# Patient Record
Sex: Female | Born: 1965 | Race: White | Hispanic: No | Marital: Married | Smoking: Former smoker
Health system: Southern US, Community
[De-identification: ages and names within clinical notes are randomized; demographics above are authoritative.]

## PROBLEM LIST (undated history)

## (undated) ENCOUNTER — Emergency Department (HOSPITAL_BASED_OUTPATIENT_CLINIC_OR_DEPARTMENT_OTHER): Admission: EM | Payer: Self-pay | Source: Home / Self Care

## (undated) DIAGNOSIS — F32A Depression, unspecified: Secondary | ICD-10-CM

## (undated) DIAGNOSIS — T7840XA Allergy, unspecified, initial encounter: Secondary | ICD-10-CM

## (undated) DIAGNOSIS — I10 Essential (primary) hypertension: Secondary | ICD-10-CM

## (undated) DIAGNOSIS — E079 Disorder of thyroid, unspecified: Secondary | ICD-10-CM

## (undated) DIAGNOSIS — F329 Major depressive disorder, single episode, unspecified: Secondary | ICD-10-CM

## (undated) HISTORY — DX: Allergy, unspecified, initial encounter: T78.40XA

---

## 1999-04-10 ENCOUNTER — Other Ambulatory Visit: Admission: RE | Admit: 1999-04-10 | Discharge: 1999-04-10 | Payer: Self-pay | Admitting: *Deleted

## 1999-05-15 ENCOUNTER — Encounter: Admission: RE | Admit: 1999-05-15 | Discharge: 1999-08-13 | Payer: Self-pay

## 2000-03-30 ENCOUNTER — Other Ambulatory Visit: Admission: RE | Admit: 2000-03-30 | Discharge: 2000-03-30 | Payer: Self-pay | Admitting: *Deleted

## 2001-04-26 ENCOUNTER — Other Ambulatory Visit: Admission: RE | Admit: 2001-04-26 | Discharge: 2001-04-26 | Payer: Self-pay | Admitting: *Deleted

## 2002-06-08 ENCOUNTER — Other Ambulatory Visit: Admission: RE | Admit: 2002-06-08 | Discharge: 2002-06-08 | Payer: Self-pay | Admitting: *Deleted

## 2003-07-04 ENCOUNTER — Other Ambulatory Visit: Admission: RE | Admit: 2003-07-04 | Discharge: 2003-07-04 | Payer: Self-pay | Admitting: *Deleted

## 2004-11-26 ENCOUNTER — Other Ambulatory Visit: Admission: RE | Admit: 2004-11-26 | Discharge: 2004-11-26 | Payer: Self-pay | Admitting: *Deleted

## 2008-11-10 ENCOUNTER — Emergency Department (HOSPITAL_COMMUNITY): Admission: EM | Admit: 2008-11-10 | Discharge: 2008-11-10 | Payer: Self-pay | Admitting: Family Medicine

## 2011-09-02 ENCOUNTER — Encounter: Payer: Self-pay | Admitting: *Deleted

## 2011-09-02 ENCOUNTER — Emergency Department (INDEPENDENT_AMBULATORY_CARE_PROVIDER_SITE_OTHER): Payer: Self-pay

## 2011-09-02 ENCOUNTER — Emergency Department (HOSPITAL_BASED_OUTPATIENT_CLINIC_OR_DEPARTMENT_OTHER)
Admission: EM | Admit: 2011-09-02 | Discharge: 2011-09-02 | Disposition: A | Discharge status: Home / Self Care | Payer: Self-pay | Attending: Emergency Medicine | Admitting: Emergency Medicine

## 2011-09-02 DIAGNOSIS — R42 Dizziness and giddiness: Secondary | ICD-10-CM

## 2011-09-02 DIAGNOSIS — R110 Nausea: Secondary | ICD-10-CM

## 2011-09-02 DIAGNOSIS — E079 Disorder of thyroid, unspecified: Secondary | ICD-10-CM | POA: Insufficient documentation

## 2011-09-02 DIAGNOSIS — F329 Major depressive disorder, single episode, unspecified: Secondary | ICD-10-CM | POA: Insufficient documentation

## 2011-09-02 DIAGNOSIS — I10 Essential (primary) hypertension: Secondary | ICD-10-CM

## 2011-09-02 DIAGNOSIS — F17200 Nicotine dependence, unspecified, uncomplicated: Secondary | ICD-10-CM | POA: Insufficient documentation

## 2011-09-02 DIAGNOSIS — Z79899 Other long term (current) drug therapy: Secondary | ICD-10-CM | POA: Insufficient documentation

## 2011-09-02 DIAGNOSIS — F3289 Other specified depressive episodes: Secondary | ICD-10-CM | POA: Insufficient documentation

## 2011-09-02 HISTORY — DX: Depression, unspecified: F32.A

## 2011-09-02 HISTORY — DX: Essential (primary) hypertension: I10

## 2011-09-02 HISTORY — DX: Disorder of thyroid, unspecified: E07.9

## 2011-09-02 HISTORY — DX: Major depressive disorder, single episode, unspecified: F32.9

## 2011-09-02 LAB — BASIC METABOLIC PANEL
BUN: 8 mg/dL (ref 6–23)
CO2: 25 mEq/L (ref 19–32)
Chloride: 101 mEq/L (ref 96–112)
Creatinine, Ser: 0.6 mg/dL (ref 0.50–1.10)
Glucose, Bld: 102 mg/dL — ABNORMAL HIGH (ref 70–99)

## 2011-09-02 LAB — CBC
HCT: 43.7 % (ref 36.0–46.0)
Hemoglobin: 15.3 g/dL — ABNORMAL HIGH (ref 12.0–15.0)
MCV: 92.4 fL (ref 78.0–100.0)
RBC: 4.73 MIL/uL (ref 3.87–5.11)
WBC: 8.2 10*3/uL (ref 4.0–10.5)

## 2011-09-02 MED ORDER — MECLIZINE HCL 25 MG PO TABS: 25.0000 mg | ORAL_TABLET | Freq: Four times a day (QID) | ORAL | Status: AC

## 2011-09-02 MED ORDER — SODIUM CHLORIDE 0.9 % IV BOLUS (SEPSIS)
250.0000 mL | Freq: Once | INTRAVENOUS | Status: AC
  Administered 2011-09-02: 250 mL via INTRAVENOUS

## 2011-09-02 MED ORDER — LABETALOL HCL 100 MG PO TABS: 100.0000 mg | ORAL_TABLET | Freq: Two times a day (BID) | ORAL | Status: DC

## 2011-09-02 MED ORDER — LABETALOL HCL 5 MG/ML IV SOLN
20.0000 mg | Freq: Once | INTRAVENOUS | Status: AC
  Administered 2011-09-02: 20 mg via INTRAVENOUS
  Filled 2011-09-02: qty 4

## 2011-09-02 MED ORDER — AMLODIPINE BESYLATE 10 MG PO TABS: 10.0000 mg | ORAL_TABLET | Freq: Every day | ORAL | Status: DC

## 2011-09-02 MED ORDER — SODIUM CHLORIDE 0.9 % IV SOLN
INTRAVENOUS | Status: DC
  Administered 2011-09-02: 1000 mL via INTRAVENOUS

## 2011-09-02 NOTE — ED Provider Notes (Signed)
History     CSN: [id number] Arrival date & time: 09/02/2011  9:35 AM   First MD Initiated Contact with Patient 09/02/11 [phone]      Chief Complaint  Patient presents with  . Hypertension    (Consider location/radiation/quality/duration/timing/severity/associated sxs/prior treatment) The history is provided by the patient.   patient is a 45 year old female with long-standing history of hypertension has been noncompliant and off meds for many months. At work today patient was experiencing vertigo room spinning she has recently had an upper respiratory infection which is resolving. Patient went to her primary care physician who sent her here by ambulance for a blood pressure of 232/140. Patient denies chest pain shortness of breath focal stroke symptoms or severe headache.  The vertigo is described as room spinning and made worse by movement of her head. Denies headache.  Past Medical History  Diagnosis Date  . Hypertension   . Thyroid disease   . Depression     History reviewed. No pertinent past surgical history.  No family history on file.  History  Substance Use Topics  . Smoking status: Current Some Day Smoker -- 1.0 packs/day for 10 years  . Smokeless tobacco: Not on file  . Alcohol Use: No    OB History    Grav Para Term Preterm Abortions TAB SAB Ect Mult Living                  Review of Systems  Constitutional: Negative for fever and chills.  HENT: Positive for congestion. Negative for sore throat and neck pain.   Eyes: Negative for photophobia, pain, redness and visual disturbance.  Respiratory: Negative for cough and shortness of breath.   Cardiovascular: Negative for chest pain, palpitations and leg swelling.  Gastrointestinal: Negative for nausea, vomiting and abdominal pain.  Genitourinary: Negative for dysuria and hematuria.  Musculoskeletal: Negative for myalgias and back pain.  Skin: Negative for rash.  Neurological: Positive for dizziness. Negative  for syncope, facial asymmetry, speech difficulty, weakness, light-headedness, numbness and headaches.  Hematological: Negative for adenopathy. Does not bruise/bleed easily.    Allergies  Review of patient's allergies indicates no known allergies.  Home Medications   Current Outpatient Rx  Name Route Sig Dispense Refill  . AMLODIPINE BESYLATE 10 MG PO TABS Oral Take 10 mg by mouth daily.      . ATENOLOL 50 MG PO TABS Oral Take 50 mg by mouth daily.      Marland Kitchen CITALOPRAM HYDROBROMIDE 20 MG PO TABS Oral Take 20 mg by mouth daily.      Marland Kitchen LABETALOL HCL 100 MG PO TABS Oral Take 100 mg by mouth 2 (two) times daily.      Marland Kitchen LEVOTHYROXINE SODIUM 100 MCG PO TABS Oral Take 100 mcg by mouth daily.      Marland Kitchen LISINOPRIL-HYDROCHLOROTHIAZIDE 20-12.5 MG PO TABS Oral Take 1 tablet by mouth daily.        BP 213/113  Pulse 72  Temp(Src) 98.7 F (37.1 C) (Oral)  Resp 20  SpO2 99%  LMP 08/27/2011  Physical Exam  Nursing note and vitals reviewed. Constitutional: She is oriented to person, place, and time. She appears well-developed and well-nourished. No distress.  HENT:  Head: Normocephalic and atraumatic.  Mouth/Throat: Oropharynx is clear and moist.  Eyes: Conjunctivae and EOM are normal. Pupils are equal, round, and reactive to light. Right eye exhibits no discharge.  Neck: Normal range of motion. Neck supple. No tracheal deviation present.  Cardiovascular: Normal rate, regular rhythm and  normal heart sounds.   No murmur heard. Pulmonary/Chest: Effort normal and breath sounds normal.  Abdominal: Soft. Bowel sounds are normal.  Musculoskeletal: Normal range of motion. She exhibits no edema and no tenderness.  Lymphadenopathy:    She has no cervical adenopathy.  Neurological: She is alert and oriented to person, place, and time. No cranial nerve deficit. She exhibits normal muscle tone. Coordination normal.       Reproducible vertigo with movement of head to left and right side.  Skin: Skin is warm  and dry. No rash noted. She is not diaphoretic.    ED Course  Procedures (including critical care time)   Date: 09/02/2011  Rate: 76  Rhythm: normal sinus rhythm  QRS Axis: normal  Intervals: normal  ST/T Wave abnormalities: nonspecific ST/T changes  Conduction Disutrbances:none  Narrative Interpretation:   Old EKG Reviewed: none available    Labs Reviewed  CBC  BASIC METABOLIC PANEL   Results for orders placed during the hospital encounter of 09/02/11  CBC      Component Value Range   WBC 8.2  4.0 - 10.5 (K/uL)   RBC 4.73  3.87 - 5.11 (MIL/uL)   Hemoglobin 15.3 (*) 12.0 - 15.0 (g/dL)   HCT 45.4  09.8 - 11.9 (%)   MCV 92.4  78.0 - 100.0 (fL)   MCH 32.3  26.0 - 34.0 (pg)   MCHC 35.0  30.0 - 36.0 (g/dL)   RDW 14.7  82.9 - 56.2 (%)   Platelets 242  150 - 400 (K/uL)  BASIC METABOLIC PANEL      Component Value Range   Sodium 138  135 - 145 (mEq/L)   Potassium 3.5  3.5 - 5.1 (mEq/L)   Chloride 101  96 - 112 (mEq/L)   CO2 25  19 - 32 (mEq/L)   Glucose, Bld 102 (*) 70 - 99 (mg/dL)   BUN 8  6 - 23 (mg/dL)   Creatinine, Ser 1.30  0.50 - 1.10 (mg/dL)   Calcium 9.1  8.4 - 86.5 (mg/dL)   GFR calc non Af Amer >90  >90 (mL/min)   GFR calc Af Amer >90  >90 (mL/min)   Results for orders placed during the hospital encounter of 09/02/11  CBC      Component Value Range   WBC 8.2  4.0 - 10.5 (K/uL)   RBC 4.73  3.87 - 5.11 (MIL/uL)   Hemoglobin 15.3 (*) 12.0 - 15.0 (g/dL)   HCT 78.4  69.6 - 29.5 (%)   MCV 92.4  78.0 - 100.0 (fL)   MCH 32.3  26.0 - 34.0 (pg)   MCHC 35.0  30.0 - 36.0 (g/dL)   RDW 28.4  13.2 - 44.0 (%)   Platelets 242  150 - 400 (K/uL)  BASIC METABOLIC PANEL      Component Value Range   Sodium 138  135 - 145 (mEq/L)   Potassium 3.5  3.5 - 5.1 (mEq/L)   Chloride 101  96 - 112 (mEq/L)   CO2 25  19 - 32 (mEq/L)   Glucose, Bld 102 (*) 70 - 99 (mg/dL)   BUN 8  6 - 23 (mg/dL)   Creatinine, Ser 1.02  0.50 - 1.10 (mg/dL)   Calcium 9.1  8.4 - 72.5 (mg/dL)   GFR  calc non Af Amer >90  >90 (mL/min)   GFR calc Af Amer >90  >90 (mL/min)   Ct Head Wo Contrast  09/02/2011  *RADIOLOGY REPORT*  Clinical Data: 45 year old female with hypertension,  elevated blood pressure, dizziness and nausea.  CT HEAD WITHOUT CONTRAST  Technique:  Contiguous axial images were obtained from the base of the skull through the vertex without contrast.  Comparison: None.  Findings: Fluid levels in the visualized maxillary sinuses.  Other visualized paranasal sinuses and mastoids are clear.  No acute osseous abnormality identified.  Visualized orbits and scalp soft tissues are within normal limits.  Cerebral volume is within normal limits for age.  No midline shift, ventriculomegaly, mass effect, evidence of mass lesion, intracranial hemorrhage or evidence of cortically based acute infarction.  Gray-white matter differentiation is within normal limits throughout the brain.  No suspicious intracranial vascular hyperdensity.  IMPRESSION: 1.  Normal noncontrast CT appearance of the brain. 2.  Maxillary sinus air-fluid levels could reflect acute sinusitis in the appropriate clinical setting.  Original Report Authenticated By: Harley Hallmark, M.D.      IMP: Uncontrolled hypertension Vertigo   MDM   Patient with uncontrolled hypertension improved somewhat with IV labetalol in the emergency department. Patient was given 20 mg IV. Blood pressure improves some count 185/109. Patient's symptoms improved. Patient did not have any chest pain shortness of breath severe headache or strokelike symptoms with the exception of vertigo. Head CT was negative. Patient has also had an upper respiratory infection the past few days and suspect that the vertigo may be viral etiology and not related to direct brain affect or stroke. Patient has been noncompliant on her hypertensive meds for months. Referred here from her primary care physician's office. We'll renew her labetalol and Norvasc that she supposed to be  on an patient will follow up with her primary care to make sure blood pressure gets under control and return here for new worse symptoms.         Shelda Jakes, MD 09/02/11 1440

## 2011-09-02 NOTE — ED Notes (Signed)
Patient states she has not felt good for the last 2-3 weeks.  Using OTC cold meds for upper respiratory congestion.  Woke up this morning and was dizzy and took an OTC dramimine with minimal relief.  Went to work and became weak and dizzy.  Went to see her doctor at [facility] and found to have a elevated bp of 250/120.  EMS called and patient was transported to ed.

## 2011-09-02 NOTE — ED Notes (Signed)
EMS reports patient was her PCP office this morning with c/o dizziness and weakness.  Blood pressure 232/140.  Patient is non compliant with her 2 beta blockers to control her blood pressure and her thyroid medication.  NSR on monitor.

## 2011-09-15 ENCOUNTER — Encounter (HOSPITAL_BASED_OUTPATIENT_CLINIC_OR_DEPARTMENT_OTHER): Payer: Self-pay | Admitting: *Deleted

## 2011-09-15 ENCOUNTER — Emergency Department (HOSPITAL_BASED_OUTPATIENT_CLINIC_OR_DEPARTMENT_OTHER)
Admission: EM | Admit: 2011-09-15 | Discharge: 2011-09-15 | Disposition: A | Discharge status: Home / Self Care | Payer: Self-pay | Attending: Emergency Medicine | Admitting: Emergency Medicine

## 2011-09-15 DIAGNOSIS — I10 Essential (primary) hypertension: Secondary | ICD-10-CM | POA: Insufficient documentation

## 2011-09-15 DIAGNOSIS — K029 Dental caries, unspecified: Secondary | ICD-10-CM | POA: Insufficient documentation

## 2011-09-15 DIAGNOSIS — R220 Localized swelling, mass and lump, head: Secondary | ICD-10-CM | POA: Insufficient documentation

## 2011-09-15 DIAGNOSIS — Z79899 Other long term (current) drug therapy: Secondary | ICD-10-CM | POA: Insufficient documentation

## 2011-09-15 DIAGNOSIS — K089 Disorder of teeth and supporting structures, unspecified: Secondary | ICD-10-CM | POA: Insufficient documentation

## 2011-09-15 DIAGNOSIS — E079 Disorder of thyroid, unspecified: Secondary | ICD-10-CM | POA: Insufficient documentation

## 2011-09-15 MED ORDER — PENICILLIN V POTASSIUM 250 MG PO TABS
500.0000 mg | ORAL_TABLET | Freq: Once | ORAL | Status: AC
  Administered 2011-09-15: 500 mg via ORAL
  Filled 2011-09-15: qty 2

## 2011-09-15 MED ORDER — OXYCODONE-ACETAMINOPHEN 5-325 MG PO TABS: 2.0000 | ORAL_TABLET | Freq: Four times a day (QID) | ORAL | Status: AC | PRN

## 2011-09-15 MED ORDER — PENICILLIN V POTASSIUM 500 MG PO TABS: 500.0000 mg | ORAL_TABLET | Freq: Four times a day (QID) | ORAL | Status: AC

## 2011-09-15 NOTE — ED Notes (Signed)
4 day history of swelling to right lower jaw and chin 2 days ago started having lower lip swelling. States she has a lower tooth that is broken off even with the gum thinks she has developed an abcess there

## 2011-09-15 NOTE — ED Provider Notes (Signed)
History     CSN: [id number] Arrival date & time: 09/15/2011 10:33 AM   First MD Initiated Contact with Patient 09/15/11 1049      Chief Complaint  Patient presents with  . Facial Swelling  . Dental Pain    (Consider location/radiation/quality/duration/timing/severity/associated sxs/prior treatment) HPI 45 yo F complains of 10/10 R mandibular quadrant mouth pain with associated swelling for the past week.  Patient with dental caries.  Patient has not seen a dentist.  Denies difficulty with breathing or swallowing.  Does have associated facial swelling but no fevers, nausea, or vomiting.  No radiation of this "sharp and throbbing" pain. Past Medical History  Diagnosis Date  . Hypertension   . Thyroid disease   . Depression     History reviewed. No pertinent past surgical history.  History reviewed. No pertinent family history.  History  Substance Use Topics  . Smoking status: Current Some Day Smoker -- 1.0 packs/day for 10 years  . Smokeless tobacco: Not on file  . Alcohol Use: No    OB History    Grav Para Term Preterm Abortions TAB SAB Ect Mult Living                  Review of Systems  HENT: Positive for facial swelling and dental problem.   All other systems reviewed and are negative.    Allergies  Review of patient's allergies indicates no known allergies.  Home Medications   Current Outpatient Rx  Name Route Sig Dispense Refill  . AMLODIPINE BESYLATE 10 MG PO TABS Oral Take 10 mg by mouth daily.     Marland Kitchen AMLODIPINE BESYLATE 10 MG PO TABS Oral Take 10 mg by mouth daily.      . ATENOLOL 50 MG PO TABS Oral Take 50 mg by mouth daily.     Marland Kitchen CITALOPRAM HYDROBROMIDE 20 MG PO TABS Oral Take 20 mg by mouth daily.     Marland Kitchen HYDROCODONE-ACETAMINOPHEN 5-500 MG PO TABS Oral Take 1 tablet by mouth every 6 (six) hours as needed. Patient said was not her medication but was in so much pain took someone else's     . IBUPROFEN 200 MG PO TABS Oral Take 800 mg by mouth every 6  (six) hours as needed. For pain to face     . LABETALOL HCL 100 MG PO TABS Oral Take 100 mg by mouth 2 (two) times daily.     Marland Kitchen LEVOTHYROXINE SODIUM 100 MCG PO TABS Oral Take 100 mcg by mouth daily.     Marland Kitchen LISINOPRIL-HYDROCHLOROTHIAZIDE 20-12.5 MG PO TABS Oral Take 1 tablet by mouth daily.      . OXYCODONE-ACETAMINOPHEN 5-325 MG PO TABS Oral Take 2 tablets by mouth every 6 (six) hours as needed for pain. 20 tablet 0  . PENICILLIN V POTASSIUM 500 MG PO TABS Oral Take 1 tablet (500 mg total) by mouth 4 (four) times daily. 28 tablet 0    BP 160/80  Pulse 83  Temp(Src) 98.2 F (36.8 C) (Oral)  Resp 20  SpO2 100%  LMP 08/27/2011  Physical Exam  Nursing note and vitals reviewed. Constitutional: She is oriented to person, place, and time. She appears well-developed and well-nourished. No distress.  HENT:  Head: Normocephalic and atraumatic.  Mouth/Throat: Oropharynx is clear and moist.         Facial swelling along the buccal mucosa near affected tooth. No dental abscess noted  Eyes: Conjunctivae and EOM are normal. Pupils are equal, round, and reactive to  light.  Neck: Normal range of motion.  Neurological: She is alert and oriented to person, place, and time. No cranial nerve deficit. She exhibits normal muscle tone. Coordination normal.  Skin: Skin is warm and dry. No rash noted.  Psychiatric: She has a normal mood and affect.    ED Course  Procedures (including critical care time)  Labs Reviewed - No data to display No results found.   1. Dental caries       MDM  Patient evaluated with no obvious dental abscess.  Patient was given a dose of Pen VK here and told it is imperative that she see a dentist. She was given a brief Rx for pain meds and antibiotic and was discharged home in good condition with precautions for reasons to return.        Cyndra Numbers, MD 09/15/11 2240

## 2012-11-24 IMAGING — CT CT HEAD W/O CM
1 series · 16 of 30 positions shown, 20 images · non-contrast
Comparison: None.

CLINICAL DATA: 44-year-old female with hypertension, elevated blood
pressure, dizziness and nausea.

CT HEAD WITHOUT CONTRAST
TECHNIQUE: Contiguous axial images were obtained from the base of
the skull through the vertex without contrast.

[Series 2: head 4.8 h37s · axial · 0.45mm/px · z∈[+1108,+1244]mm · 16 of 32 slices shown, 20 images]
[im 2/32  brain]
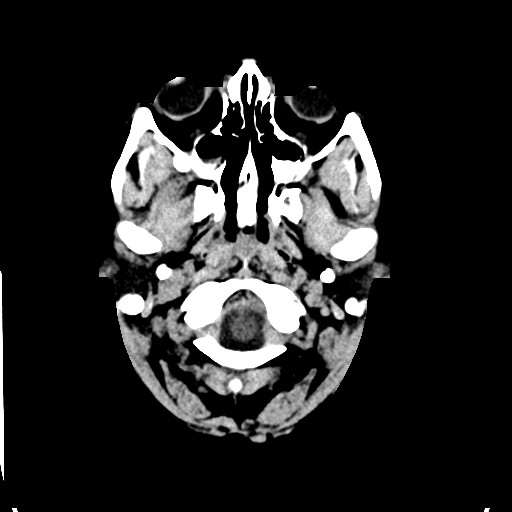
[im 2/32  bone]
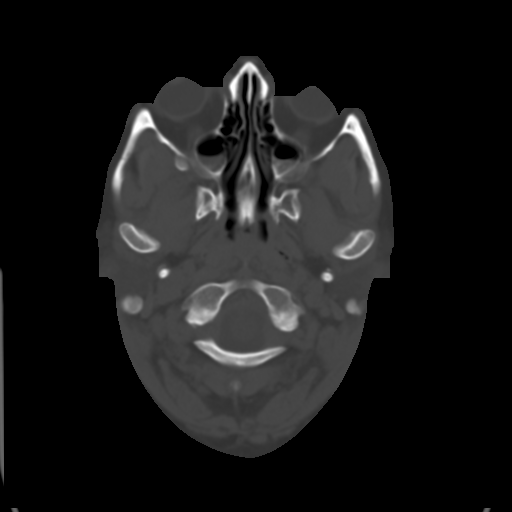
[im 4/32  brain]
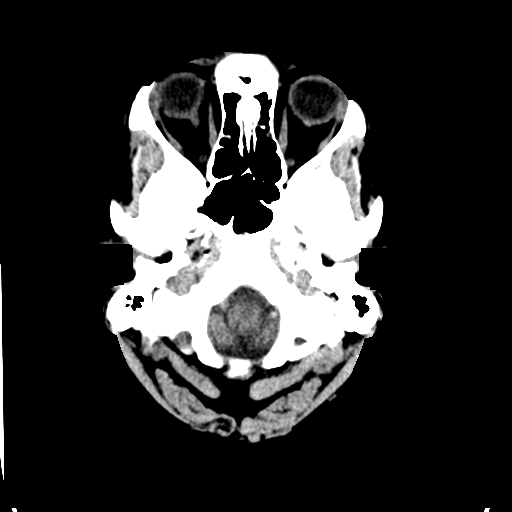
[im 6/32  brain]
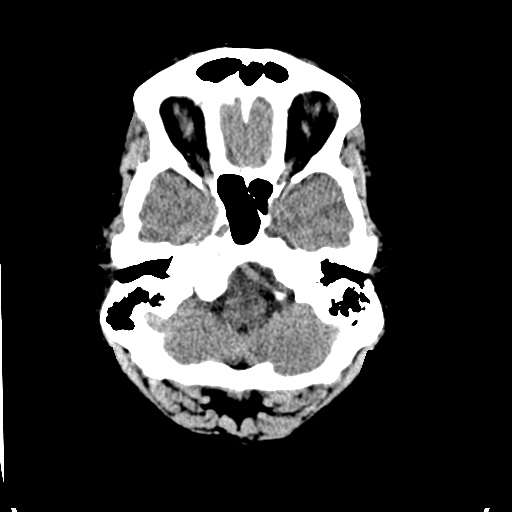
[im 8/32  brain]
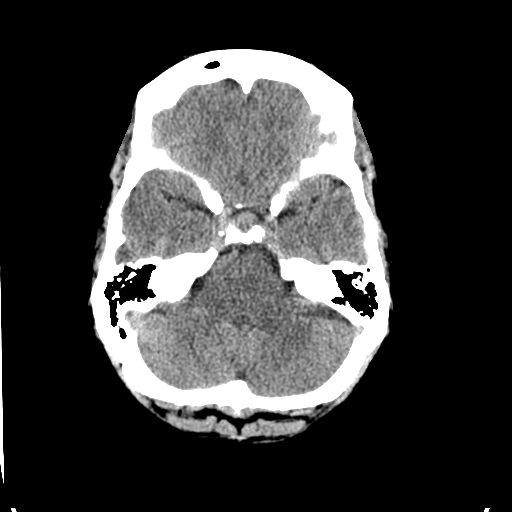
[im 9/32  brain]
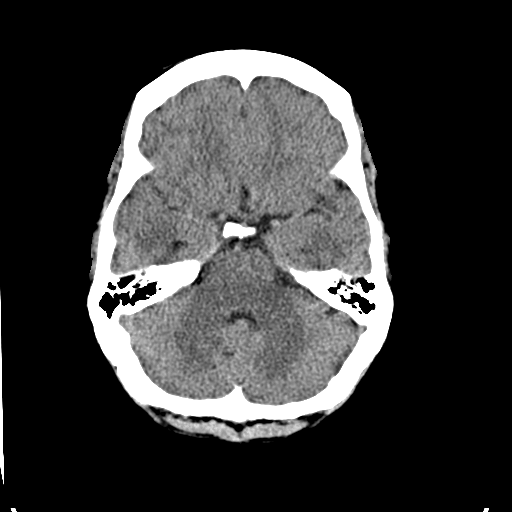
[im 9/32  bone]
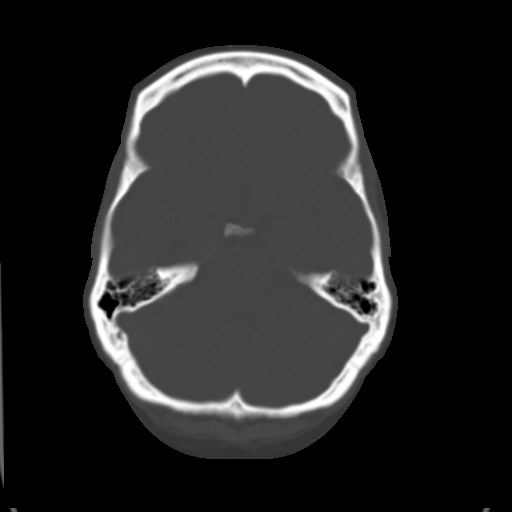
[im 11/32  brain]
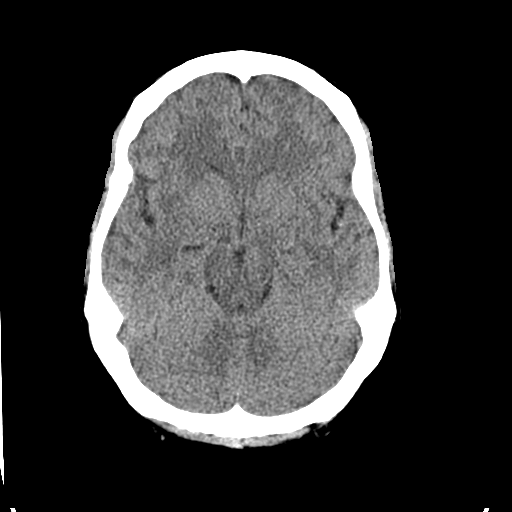
[im 13/32  brain]
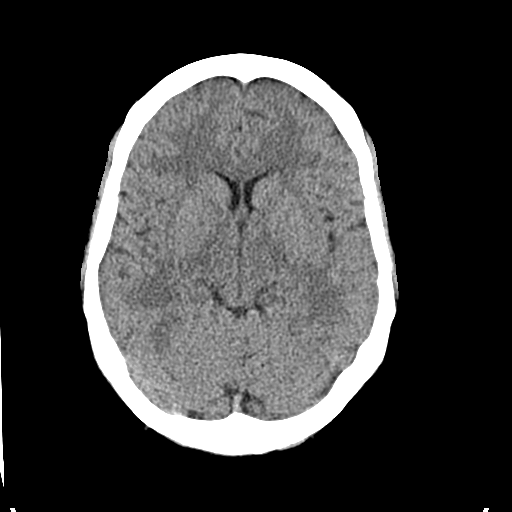
[im 15/32  brain]
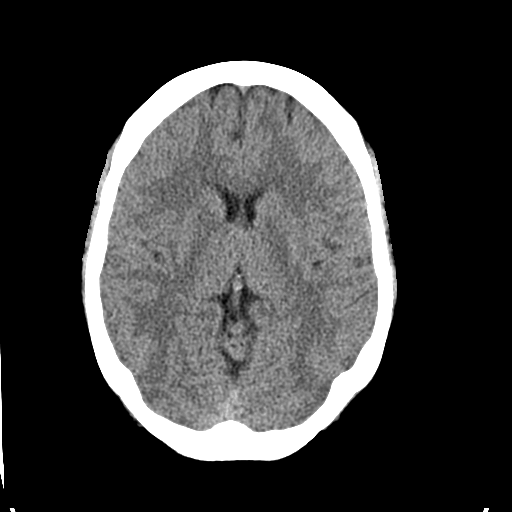
[im 17/32  brain]
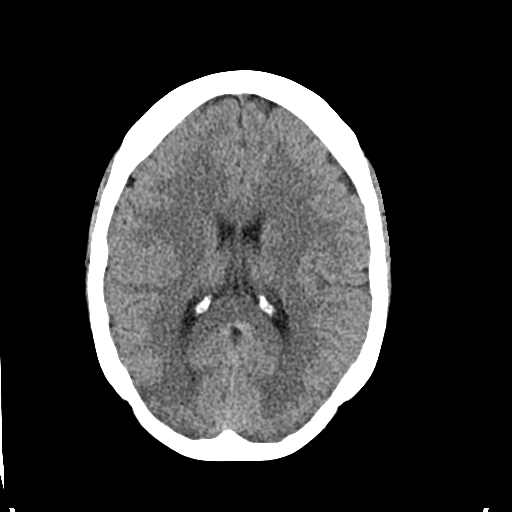
[im 17/32  bone]
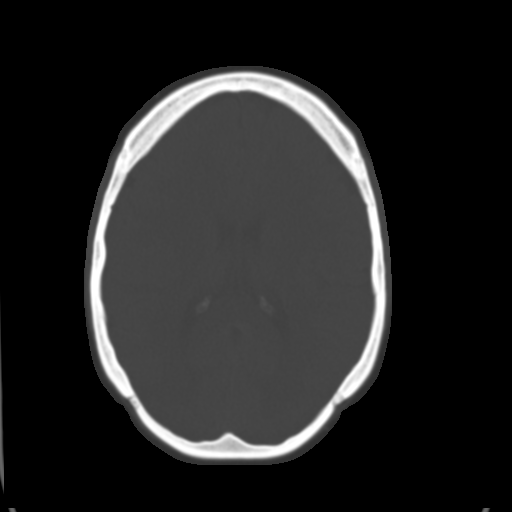
[im 19/32  brain]
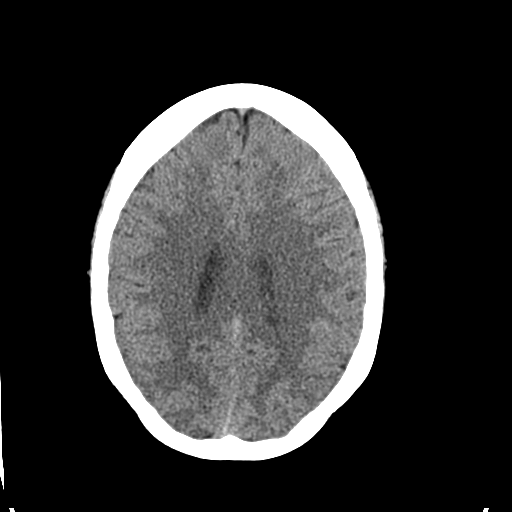
[im 21/32  brain]
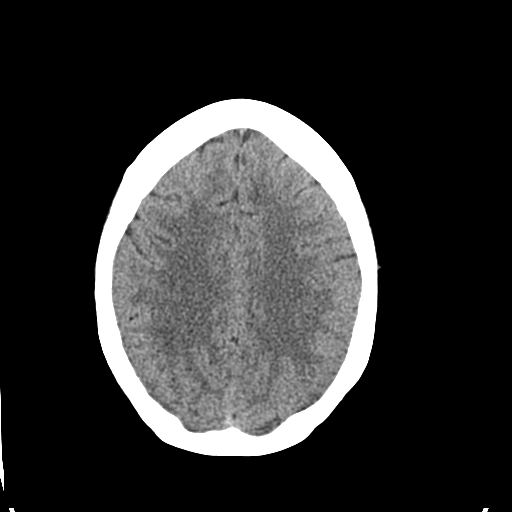
[im 23/32  brain]
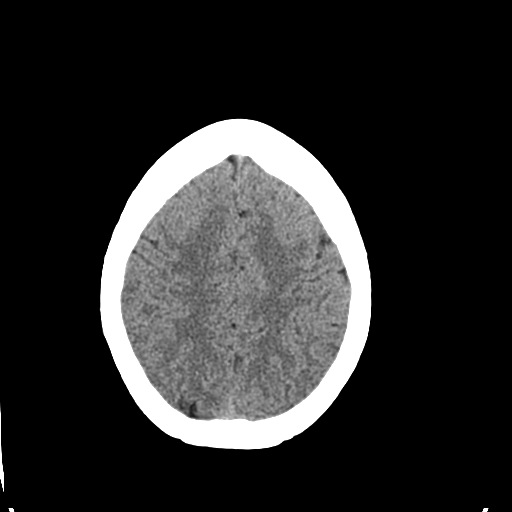
[im 24/32  brain]
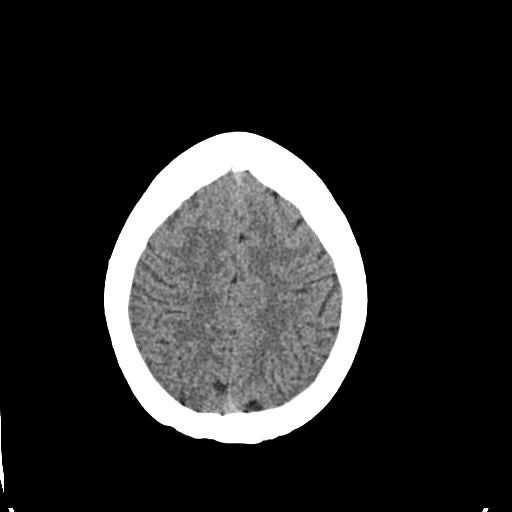
[im 24/32  bone]
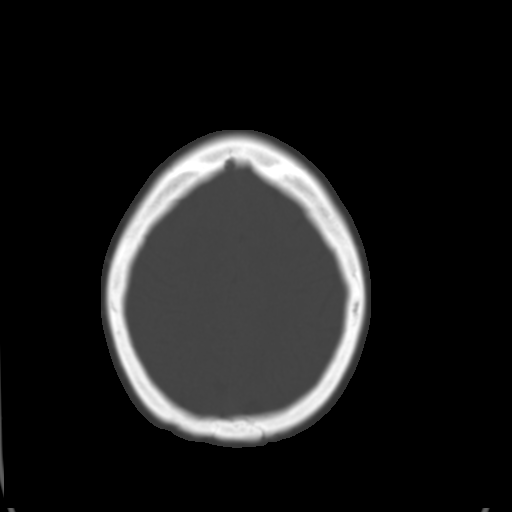
[im 26/32  brain]
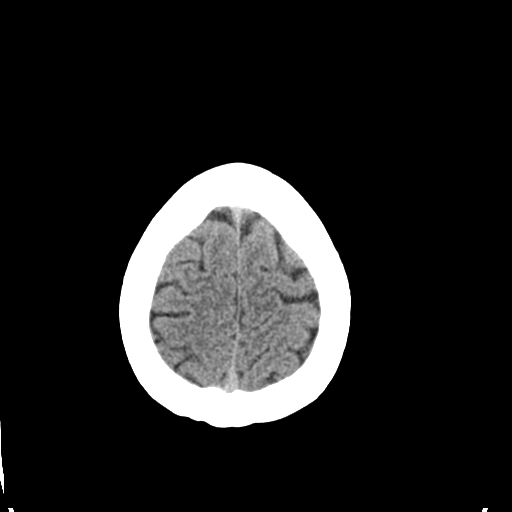
[im 28/32  brain]
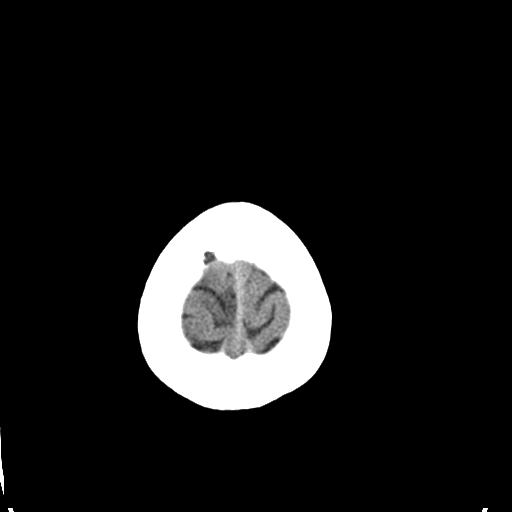
[im 30/32  brain]
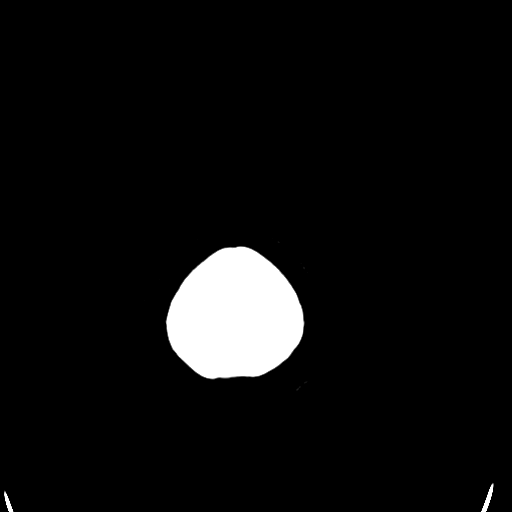

[16 of 30 positions shown; findings below may reference images not displayed]

FINDINGS: Fluid levels in the visualized maxillary sinuses.  Other
visualized paranasal sinuses and mastoids are clear.  No acute
osseous abnormality identified.  Visualized orbits and scalp soft
tissues are within normal limits.

Cerebral volume is within normal limits for age.  No midline shift,
ventriculomegaly, mass effect, evidence of mass lesion,
intracranial hemorrhage or evidence of cortically based acute
infarction.  Gray-white matter differentiation is within normal
limits throughout the brain.  No suspicious intracranial vascular
hyperdensity.
IMPRESSION: 1.  Normal noncontrast CT appearance of the brain.
2.  Maxillary sinus air-fluid levels could reflect acute sinusitis
in the appropriate clinical setting.

## 2014-02-23 ENCOUNTER — Encounter (HOSPITAL_BASED_OUTPATIENT_CLINIC_OR_DEPARTMENT_OTHER): Payer: Self-pay | Admitting: Emergency Medicine

## 2014-02-23 ENCOUNTER — Inpatient Hospital Stay (HOSPITAL_BASED_OUTPATIENT_CLINIC_OR_DEPARTMENT_OTHER)
Admission: EM | Admit: 2014-02-23 | Discharge: 2014-02-26 | DRG: 305 | Disposition: A | Discharge status: Home / Self Care | Payer: Self-pay | Attending: Internal Medicine | Admitting: Internal Medicine

## 2014-02-23 DIAGNOSIS — I359 Nonrheumatic aortic valve disorder, unspecified: Secondary | ICD-10-CM | POA: Diagnosis present

## 2014-02-23 DIAGNOSIS — J3489 Other specified disorders of nose and nasal sinuses: Secondary | ICD-10-CM | POA: Diagnosis present

## 2014-02-23 DIAGNOSIS — R5383 Other fatigue: Secondary | ICD-10-CM | POA: Diagnosis present

## 2014-02-23 DIAGNOSIS — E039 Hypothyroidism, unspecified: Secondary | ICD-10-CM | POA: Diagnosis present

## 2014-02-23 DIAGNOSIS — Z87891 Personal history of nicotine dependence: Secondary | ICD-10-CM

## 2014-02-23 DIAGNOSIS — Z79899 Other long term (current) drug therapy: Secondary | ICD-10-CM

## 2014-02-23 DIAGNOSIS — R5381 Other malaise: Secondary | ICD-10-CM | POA: Diagnosis present

## 2014-02-23 DIAGNOSIS — E079 Disorder of thyroid, unspecified: Secondary | ICD-10-CM | POA: Diagnosis present

## 2014-02-23 DIAGNOSIS — E876 Hypokalemia: Secondary | ICD-10-CM | POA: Diagnosis present

## 2014-02-23 DIAGNOSIS — J029 Acute pharyngitis, unspecified: Secondary | ICD-10-CM | POA: Diagnosis present

## 2014-02-23 DIAGNOSIS — I160 Hypertensive urgency: Secondary | ICD-10-CM | POA: Diagnosis present

## 2014-02-23 DIAGNOSIS — I10 Essential (primary) hypertension: Principal | ICD-10-CM | POA: Diagnosis present

## 2014-02-23 DIAGNOSIS — R0981 Nasal congestion: Secondary | ICD-10-CM | POA: Diagnosis present

## 2014-02-23 LAB — COMPREHENSIVE METABOLIC PANEL
ALBUMIN: 3.8 g/dL (ref 3.5–5.2)
ALBUMIN: 3.3 g/dL — AB (ref 3.5–5.2)
ALK PHOS: 115 U/L (ref 39–117)
ALK PHOS: 104 U/L (ref 39–117)
ALT: 9 U/L (ref 0–35)
ALT: 7 U/L (ref 0–35)
AST: 13 U/L (ref 0–37)
AST: 11 U/L (ref 0–37)
BILIRUBIN TOTAL: 0.2 mg/dL — AB (ref 0.3–1.2)
BILIRUBIN TOTAL: 0.2 mg/dL — AB (ref 0.3–1.2)
BUN: 7 mg/dL (ref 6–23)
BUN: 8 mg/dL (ref 6–23)
CHLORIDE: 96 meq/L (ref 96–112)
CO2: 29 mEq/L (ref 19–32)
CO2: 30 mEq/L (ref 19–32)
Calcium: 10 mg/dL (ref 8.4–10.5)
Calcium: 9.4 mg/dL (ref 8.4–10.5)
Chloride: 95 mEq/L — ABNORMAL LOW (ref 96–112)
Creatinine, Ser: 0.6 mg/dL (ref 0.50–1.10)
Creatinine, Ser: 0.61 mg/dL (ref 0.50–1.10)
GFR calc Af Amer: >90 mL/min (ref >90)
GFR calc non Af Amer: >90 mL/min (ref >90)
GFR calc non Af Amer: >90 mL/min (ref >90)
GLUCOSE: 121 mg/dL — AB (ref 70–99)
Glucose, Bld: 122 mg/dL — ABNORMAL HIGH (ref 70–99)
POTASSIUM: 2.4 meq/L — AB (ref 3.7–5.3)
POTASSIUM: 2.6 meq/L — AB (ref 3.7–5.3)
SODIUM: 141 meq/L (ref 137–147)
Sodium: 138 mEq/L (ref 137–147)
TOTAL PROTEIN: 7.7 g/dL (ref 6.0–8.3)
Total Protein: 7.2 g/dL (ref 6.0–8.3)

## 2014-02-23 LAB — BLOOD GAS, ARTERIAL
ACID-BASE EXCESS: 5.4 mmol/L — AB (ref 0.0–2.0)
BICARBONATE: 29 meq/L — AB (ref 20.0–24.0)
Drawn by: 36277
FIO2: 0.21 %
O2 SAT: 95.3 %
Patient temperature: 98.6
TCO2: 30.2 mmol/L (ref 0–100)
pCO2 arterial: 39.6 mmHg (ref 35.0–45.0)
pH, Arterial: 7.478 — ABNORMAL HIGH (ref 7.350–7.450)
pO2, Arterial: 72.6 mmHg — ABNORMAL LOW (ref 80.0–100.0)

## 2014-02-23 LAB — CBC WITH DIFFERENTIAL/PLATELET
BASOS PCT: 0 % (ref 0–1)
Basophils Absolute: 0 10*3/uL (ref 0.0–0.1)
EOS ABS: 0.1 10*3/uL (ref 0.0–0.7)
Eosinophils Relative: 0 % (ref 0–5)
HCT: 42 % (ref 36.0–46.0)
HEMOGLOBIN: 14.6 g/dL (ref 12.0–15.0)
LYMPHS ABS: 1.3 10*3/uL (ref 0.7–4.0)
Lymphocytes Relative: 11 % — ABNORMAL LOW (ref 12–46)
MCH: 33 pg (ref 26.0–34.0)
MCHC: 34.8 g/dL (ref 30.0–36.0)
MCV: 94.8 fL (ref 78.0–100.0)
MONOS PCT: 6 % (ref 3–12)
Monocytes Absolute: 0.7 10*3/uL (ref 0.1–1.0)
NEUTROS ABS: 9.9 10*3/uL — AB (ref 1.7–7.7)
NEUTROS PCT: 82 % — AB (ref 43–77)
PLATELETS: 417 10*3/uL — AB (ref 150–400)
RBC: 4.43 MIL/uL (ref 3.87–5.11)
RDW: 11.3 % — ABNORMAL LOW (ref 11.5–15.5)
WBC: 12.1 10*3/uL — ABNORMAL HIGH (ref 4.0–10.5)

## 2014-02-23 LAB — TSH: TSH: 6.3 u[IU]/mL — ABNORMAL HIGH (ref 0.350–4.500)

## 2014-02-23 LAB — MAGNESIUM: Magnesium: 2 mg/dL (ref 1.5–2.5)

## 2014-02-23 LAB — TROPONIN I

## 2014-02-23 MED ORDER — ENOXAPARIN SODIUM 40 MG/0.4ML ~~LOC~~ SOLN
40.0000 mg | SUBCUTANEOUS | Status: DC
  Administered 2014-02-24 – 2014-02-26 (×3): 40 mg via SUBCUTANEOUS
  Filled 2014-02-23 (×3): qty 0.4

## 2014-02-23 MED ORDER — CLONIDINE HCL 0.1 MG PO TABS
0.2000 mg | ORAL_TABLET | Freq: Once | ORAL | Status: AC
  Administered 2014-02-23: 0.2 mg via ORAL
  Filled 2014-02-23: qty 2

## 2014-02-23 MED ORDER — POTASSIUM CHLORIDE 10 MEQ/100ML IV SOLN
10.0000 meq | Freq: Once | INTRAVENOUS | Status: AC
  Administered 2014-02-23: 10 meq via INTRAVENOUS
  Filled 2014-02-23: qty 100

## 2014-02-23 MED ORDER — ATENOLOL 50 MG PO TABS
50.0000 mg | ORAL_TABLET | Freq: Every day | ORAL | Status: DC
  Administered 2014-02-24 – 2014-02-26 (×3): 50 mg via ORAL
  Filled 2014-02-23 (×4): qty 1

## 2014-02-23 MED ORDER — LEVOTHYROXINE SODIUM 100 MCG PO TABS
100.0000 ug | ORAL_TABLET | Freq: Every day | ORAL | Status: DC
  Administered 2014-02-24: 100 ug via ORAL
  Filled 2014-02-23 (×2): qty 1

## 2014-02-23 MED ORDER — SALINE SPRAY 0.65 % NA SOLN
1.0000 | NASAL | Status: DC | PRN
  Filled 2014-02-23: qty 44

## 2014-02-23 MED ORDER — LABETALOL HCL 200 MG PO TABS
200.0000 mg | ORAL_TABLET | Freq: Once | ORAL | Status: DC
  Filled 2014-02-23: qty 1

## 2014-02-23 MED ORDER — SODIUM CHLORIDE 0.9 % IJ SOLN: 3.0000 mL | Freq: Two times a day (BID) | INTRAMUSCULAR | Status: DC

## 2014-02-23 MED ORDER — ONDANSETRON HCL 4 MG/2ML IJ SOLN: 4.0000 mg | Freq: Four times a day (QID) | INTRAMUSCULAR | Status: DC | PRN

## 2014-02-23 MED ORDER — POTASSIUM CHLORIDE 10 MEQ/100ML IV SOLN
10.0000 meq | INTRAVENOUS | Status: AC
  Administered 2014-02-24 (×3): 10 meq via INTRAVENOUS
  Filled 2014-02-23 (×3): qty 100

## 2014-02-23 MED ORDER — POTASSIUM CHLORIDE CRYS ER 20 MEQ PO TBCR
40.0000 meq | EXTENDED_RELEASE_TABLET | Freq: Once | ORAL | Status: AC
  Administered 2014-02-23: 40 meq via ORAL
  Filled 2014-02-23: qty 2

## 2014-02-23 MED ORDER — AMLODIPINE BESYLATE 5 MG PO TABS
10.0000 mg | ORAL_TABLET | Freq: Once | ORAL | Status: AC
  Administered 2014-02-23: 10 mg via ORAL
  Filled 2014-02-23: qty 2

## 2014-02-23 MED ORDER — POTASSIUM CHLORIDE 10 MEQ/100ML IV SOLN
10.0000 meq | INTRAVENOUS | Status: AC
  Administered 2014-02-23 (×2): 10 meq via INTRAVENOUS
  Filled 2014-02-23 (×2): qty 100

## 2014-02-23 MED ORDER — ACETAMINOPHEN 650 MG RE SUPP: 650.0000 mg | Freq: Four times a day (QID) | RECTAL | Status: DC | PRN

## 2014-02-23 MED ORDER — FLUTICASONE PROPIONATE 50 MCG/ACT NA SUSP
1.0000 | Freq: Every day | NASAL | Status: DC
Start: 2014-02-23 — End: 2014-02-26
  Administered 2014-02-23 – 2014-02-26 (×4): 1 via NASAL
  Filled 2014-02-23: qty 16

## 2014-02-23 MED ORDER — HYDRALAZINE HCL 20 MG/ML IJ SOLN
10.0000 mg | INTRAMUSCULAR | Status: DC | PRN
  Administered 2014-02-24 – 2014-02-25 (×2): 10 mg via INTRAVENOUS
  Filled 2014-02-23 (×2): qty 1

## 2014-02-23 MED ORDER — ATENOLOL 25 MG PO TABS
50.0000 mg | ORAL_TABLET | Freq: Once | ORAL | Status: AC
  Administered 2014-02-23: 50 mg via ORAL
  Filled 2014-02-23: qty 2

## 2014-02-23 MED ORDER — LABETALOL HCL 100 MG PO TABS: 100.0000 mg | ORAL_TABLET | Freq: Two times a day (BID) | ORAL | Status: DC

## 2014-02-23 MED ORDER — AMLODIPINE BESYLATE 10 MG PO TABS
10.0000 mg | ORAL_TABLET | Freq: Every day | ORAL | Status: DC
  Administered 2014-02-24 – 2014-02-26 (×3): 10 mg via ORAL
  Filled 2014-02-23 (×4): qty 1

## 2014-02-23 MED ORDER — BISACODYL 10 MG RE SUPP: 10.0000 mg | Freq: Every day | RECTAL | Status: DC | PRN

## 2014-02-23 MED ORDER — LISINOPRIL 10 MG PO TABS
20.0000 mg | ORAL_TABLET | Freq: Once | ORAL | Status: AC
  Administered 2014-02-23: 20 mg via ORAL
  Filled 2014-02-23: qty 2

## 2014-02-23 MED ORDER — ACETAMINOPHEN 325 MG PO TABS
650.0000 mg | ORAL_TABLET | Freq: Four times a day (QID) | ORAL | Status: DC | PRN
  Administered 2014-02-23 – 2014-02-24 (×2): 650 mg via ORAL
  Filled 2014-02-23 (×2): qty 2

## 2014-02-23 MED ORDER — ONDANSETRON HCL 4 MG PO TABS: 4.0000 mg | ORAL_TABLET | Freq: Four times a day (QID) | ORAL | Status: DC | PRN

## 2014-02-23 MED ORDER — POLYETHYLENE GLYCOL 3350 17 G PO PACK
17.0000 g | PACK | Freq: Every day | ORAL | Status: DC
  Administered 2014-02-23 – 2014-02-24 (×2): 17 g via ORAL
  Filled 2014-02-23 (×4): qty 1

## 2014-02-23 MED ORDER — HYDROCODONE-ACETAMINOPHEN 5-325 MG PO TABS
1.0000 | ORAL_TABLET | ORAL | Status: DC | PRN
  Administered 2014-02-24 – 2014-02-26 (×12): 2 via ORAL
  Filled 2014-02-23 (×7): qty 2
  Filled 2014-02-23: qty 1
  Filled 2014-02-23 (×4): qty 2
  Filled 2014-02-23: qty 1

## 2014-02-23 MED ORDER — CITALOPRAM HYDROBROMIDE 20 MG PO TABS
20.0000 mg | ORAL_TABLET | Freq: Every day | ORAL | Status: DC
  Administered 2014-02-23 – 2014-02-26 (×4): 20 mg via ORAL
  Filled 2014-02-23 (×4): qty 1

## 2014-02-23 NOTE — Progress Notes (Signed)
NURSING PROGRESS NOTE  Ashok CroonMelissa B Friedlander [id number] Admission Data: 02/23/2014 6:51 PM Attending Provider: Renae FickleMackenzie Short, MD PCP:No primary provider on file. Code Status: Full  Piedad B Marcelle OverlieHolland is a 48 y.o. female patient admitted from ED:  -No acute distress noted.  -No complaints of shortness of breath.  -No complaints of chest pain.    Blood pressure 162/102, pulse 70, temperature 98.3 F (36.8 C), temperature source Oral, resp. rate 20, height 5\' 7"  (1.702 m), weight 90.719 kg (200 lb), last menstrual period 02/04/2014, SpO2 100.00%.   IV Fluids:  IV in place, occlusive dsg intact without redness, IV cath 20g Right AC, patent, no redness.  Allergies:  Review of patient's allergies indicates no known allergies.  Past Medical History:   has a past medical history of Hypertension; Thyroid disease; and Depression.  Past Surgical History:   has no past surgical history on file.  Social History:   reports that she has quit smoking. She does not have any smokeless tobacco history on file. She reports that she does not drink alcohol or use illicit drugs.  Skin: Intact  Patient/Family orientated to room. Information packet given to patient/family. Admission inpatient armband information verified with patient/family to include name and date of birth and placed on patient arm. Side rails up x 2, fall assessment and education completed with patient/family. Patient/family able to verbalize understanding of risk associated with falls and verbalized understanding to call for assistance before getting out of bed. Call light within reach. Patient/family able to voice and demonstrate understanding of unit orientation instructions.    Will continue to evaluate and treat per MD orders.

## 2014-02-23 NOTE — Progress Notes (Signed)
CRITICAL VALUE ALERT  Critical value received:  K+ 2.6   Date of notification:  02/23/14  Time of notification:  2246  Critical value read back:yes  Nurse who received alert:  Judee Clararimaine Butler, RN  MD notified (1st page):  Claiborne Billingsallahan, NP   Time of first page:  2250  MD notified (2nd page):  Time of second page:  Responding MD:  Claiborne Billingsallahan, NP  Time MD responded:  2252   NP stated he will order BMET for am as patient is currently receiving 2 runs of IV K+

## 2014-02-23 NOTE — ED Notes (Signed)
Reports onset of not feeling well yesterday, worsening today.  C/o 'I don't feel good', dizziness, light headed.  History of HTN, is taking her BP medications but states 'they aren't working.'  Denies chest pain, but c/o palpitations.

## 2014-02-23 NOTE — ED Provider Notes (Signed)
CSN: [id number]     Arrival date & time 02/23/14  1413 History   First MD Initiated Contact with Patient 02/23/14 1425     Chief Complaint  Patient presents with  . Dizziness     (Consider location/radiation/quality/duration/timing/severity/associated sxs/prior Treatment) HPI Comments: Patient is a 48 year old female history of hypertension and thyroid disease. She presents with complaints of "not feeling well" for the past 2 days. She feels dizzy and reports headache. She is on several antihypertensives which she has informed me she has not taken in the past 2 days because she has not felt well. Denies any fevers or chills. She denies any chest pain or shortness of breath. She does report intermittent palpitations.  Patient is a 48 y.o. female presenting with hypertension. The history is provided by the patient.  Hypertension This is a chronic problem. The problem occurs constantly. The problem has been gradually worsening. Associated symptoms include headaches. Pertinent negatives include no chest pain and no shortness of breath. Nothing aggravates the symptoms. She has tried nothing for the symptoms.    Past Medical History  Diagnosis Date  . Hypertension   . Thyroid disease   . Depression    History reviewed. No pertinent past surgical history. No family history on file. History  Substance Use Topics  . Smoking status: Former Smoker -- 1.00 packs/day for 10 years  . Smokeless tobacco: Not on file  . Alcohol Use: No   OB History   Grav Para Term Preterm Abortions TAB SAB Ect Mult Living                 Review of Systems  Respiratory: Negative for shortness of breath.   Cardiovascular: Negative for chest pain.  Neurological: Positive for headaches.  All other systems reviewed and are negative.     Allergies  Review of patient's allergies indicates no known allergies.  Home Medications   Prior to Admission medications   Medication Sig Start Date End Date Taking?  Authorizing Provider  amLODipine (NORVASC) 10 MG tablet Take 10 mg by mouth daily.     Historical Provider, MD  amLODipine (NORVASC) 10 MG tablet Take 10 mg by mouth daily.   09/02/11 09/01/12  Shelda JakesScott W. Zackowski, MD  atenolol (TENORMIN) 50 MG tablet Take 50 mg by mouth daily.     Historical Provider, MD  citalopram (CELEXA) 20 MG tablet Take 20 mg by mouth daily.     Historical Provider, MD  HYDROcodone-acetaminophen (VICODIN) 5-500 MG per tablet Take 1 tablet by mouth every 6 (six) hours as needed. Patient said was not her medication but was in so much pain took someone else's     Historical Provider, MD  ibuprofen (ADVIL,MOTRIN) 200 MG tablet Take 800 mg by mouth every 6 (six) hours as needed. For pain to face     Historical Provider, MD  labetalol (NORMODYNE) 100 MG tablet Take 100 mg by mouth 2 (two) times daily.     Historical Provider, MD  levothyroxine (SYNTHROID, LEVOTHROID) 100 MCG tablet Take 100 mcg by mouth daily.     Historical Provider, MD  lisinopril-hydrochlorothiazide (PRINZIDE,ZESTORETIC) 20-12.5 MG per tablet Take 1 tablet by mouth daily.      Historical Provider, MD   BP 209/112  Pulse 89  Temp(Src) 98.3 F (36.8 C) (Oral)  Resp 20  Ht 5\' 7"  (1.702 m)  Wt 200 lb (90.719 kg)  BMI 31.32 kg/m2  SpO2 100%  LMP 02/04/2014 Physical Exam  Nursing note and vitals reviewed. Constitutional:  She is oriented to person, place, and time. She appears well-developed and well-nourished. No distress.  HENT:  Head: Normocephalic and atraumatic.  Eyes: EOM are normal. Pupils are equal, round, and reactive to light.  There is no papilledema.   Neck: Normal range of motion. Neck supple.  Cardiovascular: Normal rate and regular rhythm.  Exam reveals no gallop and no friction rub.   No murmur heard. Pulmonary/Chest: Effort normal and breath sounds normal. No respiratory distress. She has no wheezes.  Abdominal: Soft. Bowel sounds are normal. She exhibits no distension. There is no  tenderness.  Musculoskeletal: Normal range of motion. She exhibits no edema.  Neurological: She is alert and oriented to person, place, and time. No cranial nerve deficit. She exhibits normal muscle tone. Coordination normal.  Skin: Skin is warm and dry. She is not diaphoretic.    ED Course  Procedures (including critical care time) Labs Review Labs Reviewed  CBC WITH DIFFERENTIAL - Abnormal; Notable for the following:    WBC 12.1 (*)    RDW 11.3 (*)    Platelets 417 (*)    Neutrophils Relative % 82 (*)    Neutro Abs 9.9 (*)    Lymphocytes Relative 11 (*)    All other components within normal limits  COMPREHENSIVE METABOLIC PANEL  TROPONIN I    Imaging Review No results found.   EKG Interpretation   Date/Time:  Saturday February 23 2014 14:38:19 EDT Ventricular Rate:  107 PR Interval:  168 QRS Duration: 84 QT Interval:  462 QTC Calculation: 616 R Axis:   69 Text Interpretation:  Sinus tachycardia with Premature atrial complexes  with Abberant conduction Left ventricular hypertrophy with repolarization  abnormality Prolonged QT Abnormal ECG Confirmed by DELOS  MD, Riley LamUGLAS  ([id number]) on 02/23/2014 3:12:51 PM      MDM   Final diagnoses:  None    Patient is a 48 year old female who presents with complaints of weakness and generally not feeling well. She has not been able to take her blood pressure medication as she has felt nauseated. Her blood pressure is elevated. Potassium was found to be 2.4. She was given her home medications and clonidine and blood pressures improved somewhat. I've written for IV and oral potassium, but feel as though she needs to be admitted for potassium replacement and control of her blood pressure. I've spoken with Dr. Malachi BondsShort from the hospitalist service who agrees to admit the patient.    Geoffery Lyonsouglas Alec Jaros, MD 02/23/14 1550

## 2014-02-24 DIAGNOSIS — E039 Hypothyroidism, unspecified: Secondary | ICD-10-CM | POA: Diagnosis present

## 2014-02-24 DIAGNOSIS — R0981 Nasal congestion: Secondary | ICD-10-CM | POA: Diagnosis present

## 2014-02-24 DIAGNOSIS — E876 Hypokalemia: Secondary | ICD-10-CM | POA: Diagnosis present

## 2014-02-24 DIAGNOSIS — R5383 Other fatigue: Secondary | ICD-10-CM | POA: Diagnosis present

## 2014-02-24 LAB — NA AND K (SODIUM & POTASSIUM), RAND UR
Potassium Urine: 28 mEq/L
Sodium, Ur: 71 mEq/L

## 2014-02-24 LAB — URINALYSIS, ROUTINE W REFLEX MICROSCOPIC
Glucose, UA: NEGATIVE mg/dL
Ketones, ur: NEGATIVE mg/dL
LEUKOCYTES UA: NEGATIVE
Nitrite: NEGATIVE
PROTEIN: NEGATIVE mg/dL
Specific Gravity, Urine: 1.02 (ref 1.005–1.030)
UROBILINOGEN UA: 1 mg/dL (ref 0.0–1.0)
pH: 7 (ref 5.0–8.0)

## 2014-02-24 LAB — CBC WITH DIFFERENTIAL/PLATELET
BASOS PCT: 0 % (ref 0–1)
Basophils Absolute: 0 10*3/uL (ref 0.0–0.1)
EOS PCT: 1 % (ref 0–5)
Eosinophils Absolute: 0.1 10*3/uL (ref 0.0–0.7)
HEMATOCRIT: 40.4 % (ref 36.0–46.0)
HEMOGLOBIN: 13.7 g/dL (ref 12.0–15.0)
Lymphocytes Relative: 20 % (ref 12–46)
Lymphs Abs: 1.8 10*3/uL (ref 0.7–4.0)
MCH: 32.6 pg (ref 26.0–34.0)
MCHC: 33.9 g/dL (ref 30.0–36.0)
MCV: 96.2 fL (ref 78.0–100.0)
MONO ABS: 0.6 10*3/uL (ref 0.1–1.0)
Monocytes Relative: 7 % (ref 3–12)
Neutro Abs: 6.6 10*3/uL (ref 1.7–7.7)
Neutrophils Relative %: 72 % (ref 43–77)
Platelets: 383 10*3/uL (ref 150–400)
RBC: 4.2 MIL/uL (ref 3.87–5.11)
RDW: 11.9 % (ref 11.5–15.5)
WBC: 9.1 10*3/uL (ref 4.0–10.5)

## 2014-02-24 LAB — BASIC METABOLIC PANEL
BUN: 7 mg/dL (ref 6–23)
CO2: 28 mEq/L (ref 19–32)
CREATININE: 0.6 mg/dL (ref 0.50–1.10)
Calcium: 9.7 mg/dL (ref 8.4–10.5)
Chloride: 98 mEq/L (ref 96–112)
Glucose, Bld: 100 mg/dL — ABNORMAL HIGH (ref 70–99)
Potassium: 3.3 mEq/L — ABNORMAL LOW (ref 3.7–5.3)
SODIUM: 140 meq/L (ref 137–147)

## 2014-02-24 LAB — T4, FREE: FREE T4: 0.77 ng/dL — AB (ref 0.80–1.80)

## 2014-02-24 LAB — OSMOLALITY, URINE: OSMOLALITY UR: 461 mosm/kg (ref 390–1090)

## 2014-02-24 LAB — URINE MICROSCOPIC-ADD ON

## 2014-02-24 LAB — RAPID STREP SCREEN (MED CTR MEBANE ONLY): Streptococcus, Group A Screen (Direct): NEGATIVE

## 2014-02-24 LAB — INFLUENZA PANEL BY PCR (TYPE A & B)
H1N1 flu by pcr: NOT DETECTED
Influenza A By PCR: NEGATIVE
Influenza B By PCR: NEGATIVE

## 2014-02-24 LAB — TSH: TSH: 10.41 u[IU]/mL — ABNORMAL HIGH (ref 0.350–4.500)

## 2014-02-24 LAB — CREATININE, URINE, RANDOM: CREATININE, URINE: 171.24 mg/dL

## 2014-02-24 MED ORDER — SODIUM CHLORIDE 0.9 % IV BOLUS (SEPSIS)
1000.0000 mL | Freq: Once | INTRAVENOUS | Status: AC
  Administered 2014-02-24: 1000 mL via INTRAVENOUS

## 2014-02-24 MED ORDER — DEXTROSE 5 % IV SOLN
500.0000 mg | INTRAVENOUS | Status: DC
  Administered 2014-02-24: 500 mg via INTRAVENOUS
  Filled 2014-02-24 (×3): qty 500

## 2014-02-24 MED ORDER — MENTHOL 3 MG MT LOZG
1.0000 | LOZENGE | OROMUCOSAL | Status: DC | PRN
  Administered 2014-02-24 – 2014-02-25 (×2): 3 mg via ORAL
  Filled 2014-02-24 (×2): qty 9

## 2014-02-24 MED ORDER — POTASSIUM CHLORIDE CRYS ER 20 MEQ PO TBCR
40.0000 meq | EXTENDED_RELEASE_TABLET | Freq: Four times a day (QID) | ORAL | Status: AC
  Administered 2014-02-24 (×2): 40 meq via ORAL
  Filled 2014-02-24 (×2): qty 2

## 2014-02-24 MED ORDER — LISINOPRIL 20 MG PO TABS
20.0000 mg | ORAL_TABLET | Freq: Every day | ORAL | Status: DC
  Administered 2014-02-24: 20 mg via ORAL
  Filled 2014-02-24 (×2): qty 1

## 2014-02-24 MED ORDER — SODIUM CHLORIDE 0.9 % IV SOLN
INTRAVENOUS | Status: DC
  Administered 2014-02-24: 20:00:00 via INTRAVENOUS
  Administered 2014-02-25: 100 mL/h via INTRAVENOUS
  Administered 2014-02-25 – 2014-02-26 (×2): via INTRAVENOUS

## 2014-02-24 MED ORDER — LEVOTHYROXINE SODIUM 125 MCG PO TABS
125.0000 ug | ORAL_TABLET | Freq: Every day | ORAL | Status: DC
  Administered 2014-02-25 – 2014-02-26 (×2): 125 ug via ORAL
  Filled 2014-02-24 (×3): qty 1

## 2014-02-24 MED ORDER — POLYETHYLENE GLYCOL 3350 17 G PO PACK: 17.0000 g | PACK | Freq: Every day | ORAL | Status: DC

## 2014-02-24 MED ORDER — DEXTROSE 5 % IV SOLN
1.0000 g | INTRAVENOUS | Status: DC
  Administered 2014-02-24: 1 g via INTRAVENOUS
  Filled 2014-02-24 (×2): qty 10

## 2014-02-24 NOTE — H&P (Signed)
[facility] History and Physical  Patient: Susan Parsons  WUJ:[id number]  DOB: 1966-03-01  DOS: the patient was seen and examined on 02/23/2014 PCP: No primary provider on file.  Chief Complaint: fatigue  HPI: Susan Parsons is a 48 y.o. female with Past medical history of hypertension and hypothyroidism. The patient presented with complaints of generalized fatigue. She mentions that since last 2 days she has been having complaints of generalized fatigue and tiredness this was associated with dizziness. She describes her dizziness as location of the lightheadedness and occasionally room spinning around. Her dizziness primarily arises when she changes position. She mentions when she is lying down without moving her head she does not have any dizziness. With that she also had some nausea today. She complains of frontal headache and stuffed nose that is ongoing since last 2 days. She denies any blurring of vision. She complains of sore throat without any cough. She denies any fever chills chest pain shortness of breath chest tightness. She denies any abdominal pain. She complains of constipation since last for 5 days. She denies any burning urination or bleeding anywhere. She denies any leg swelling orthopnea or PND. She has history of hypertension in her brother who is 3035, her mother her maternal grandmother and grandfather.  The patient is coming from home. And at her baseline independent for most of her ADL.  Review of Systems: as mentioned in the history of present illness.  A Comprehensive review of the other systems is negative.  Past Medical History  Diagnosis Date  . Hypertension   . Thyroid disease   . Depression    History reviewed. No pertinent past surgical history. Social History:  reports that she quit smoking about 4 years ago. She does not have any smokeless tobacco history on file. She reports that she does not drink alcohol or use illicit drugs.  No Known  Allergies  History reviewed. No pertinent family history.  Prior to Admission medications   Medication Sig Start Date End Date Taking? Authorizing Provider  amLODipine (NORVASC) 10 MG tablet Take 10 mg by mouth daily.    Yes Historical Provider, MD  atenolol (TENORMIN) 50 MG tablet Take 50 mg by mouth daily.    Yes Historical Provider, MD  citalopram (CELEXA) 20 MG tablet Take 20 mg by mouth daily.    Yes Historical Provider, MD  HYDROcodone-acetaminophen (NORCO) 10-325 MG per tablet Take 1 tablet by mouth 3 (three) times daily as needed for severe pain.   Yes Historical Provider, MD  labetalol (NORMODYNE) 100 MG tablet Take 100 mg by mouth 2 (two) times daily.    Yes Historical Provider, MD  levothyroxine (SYNTHROID, LEVOTHROID) 100 MCG tablet Take 100 mcg by mouth daily.    Yes Historical Provider, MD  lisinopril-hydrochlorothiazide (PRINZIDE,ZESTORETIC) 20-12.5 MG per tablet Take 1 tablet by mouth daily.     Yes Historical Provider, MD  amLODipine (NORVASC) 10 MG tablet Take 10 mg by mouth daily.   09/02/11 09/01/12  Shelda JakesScott W. Zackowski, MD    Physical Exam: Filed Vitals:   02/23/14 1554 02/23/14 1655 02/23/14 1902 02/23/14 2026  BP: 177/79 162/102 156/90 145/97  Pulse: 75 70 74 68  Temp:   98.4 F (36.9 C) 98.4 F (36.9 C)  TempSrc:   Oral Oral  Resp:  20 15 18   Height:   5\' 6"  (1.676 m)   Weight:   88.27 kg (194 lb 9.6 oz)   SpO2:   96% 97%    General:  Alert, Awake and Oriented to Time, Place and Person. Appear in moderate distress Eyes: PERRL ENT: Oral Mucosa clear moist. Neck: no JVD Cardiovascular: S1 and S2 Present, aortic systolic Murmur, Peripheral Pulses Present Respiratory: Bilateral Air entry equal and Decreased, Clear to Auscultation,  no Crackles,no wheezes Abdomen: Bowel Sound Present, Soft and Non tender Skin: no Rash Extremities: no Pedal edema, no calf tenderness Neurologic: Grossly no focal neuro deficit.lethargic   Labs on Admission:  CBC:  Recent  Labs Lab 02/23/14 1445  WBC 12.1*  NEUTROABS 9.9*  HGB 14.6  HCT 42.0  MCV 94.8  PLT 417*    CMP     Component Value Date/Time   NA 138 02/23/2014 2120   K 2.6* 02/23/2014 2120   CL 95* 02/23/2014 2120   CO2 30 02/23/2014 2120   GLUCOSE 121* 02/23/2014 2120   BUN 8 02/23/2014 2120   CREATININE 0.61 02/23/2014 2120   CALCIUM 9.4 02/23/2014 2120   PROT 7.2 02/23/2014 2120   ALBUMIN 3.3* 02/23/2014 2120   AST 11 02/23/2014 2120   ALT 7 02/23/2014 2120   ALKPHOS 104 02/23/2014 2120   BILITOT 0.2* 02/23/2014 2120   GFRNONAA >90 02/23/2014 2120   GFRAA >90 02/23/2014 2120    No results found for this basename: LIPASE, AMYLASE,  in the last 168 hours No results found for this basename: AMMONIA,  in the last 168 hours   Recent Labs Lab 02/23/14 1445  TROPONINI <0.30   BNP (last 3 results) No results found for this basename: PROBNP,  in the last 8760 hours  Radiological Exams on Admission: No results found.  EKG: Independently reviewed. normal sinus rhythm, nonspecific ST and T waves changes.  Assessment/Plan Principal Problem:   Hypertensive urgency Active Problems:   Hypothyroidism   Hypokalemia   Fatigue   Nasal congestion   1. Hypertensive urgency The patient is presenting with complaints of generalized dizziness fatigue and tiredness. She was found to have significant elevated blood pressure about 200. She also was found to have hypokalemia. She denies any prior awareness of hypokalemia. She has been found to have a high blood pressure since last to 3 years and mentions she is on her current medications for blood pressure at least last 6 months. She denies any prior workup for secondary causes of hypertension. Patient's initial CT scan is negative for any acute abnormality. Her EKG does not show any acute abnormality as well. With that at present I would continue the patient on her home antihypertensive medication since her blood pressure has improved from 200 systolic to  150 systolic. She will require further workup for secondary causes of hypertension primarily PRA PRC due to presence of hypokalemia rule out primary hyperaldosteronism.  2. Hypokalemia Etiology unclear possibility include primary hyperaldosteronism causing hypokalemia and hypertension, efect of hydrochlorothiazide, poor oral intake, urinary/gastrointestinal loss. At present I would give her IV potassium as well as oral potassium for this and, check magnesium, TSH, urinary creatinine, sodium, potassium, pH  3. Hypothyroidism Can potentially be the cause for patient's hypertension Check TSH and free T4  4. Aortic murmur Patient has aortic murmur with complaints of dizziness and hypertension I would check echocardiogram for further workup.  5. Nasal congestion Flonase and Ocean spray  DVT Prophylaxis: subcutaneous Heparin Nutrition: cardiac diet  Code Status: full  Disposition: Admitted to observation in telemetry unit.  Author: Lynden OxfordPranav Patel, MD [facility] Pager: [phone]  If 7PM-7AM, please contact night-coverage [email or web address] Password [email or web address]

## 2014-02-24 NOTE — Progress Notes (Signed)
Rechecked BP 160/90. Notified Claiborne Billingsallahan, NP. Will continue to monitor patient.

## 2014-02-24 NOTE — Progress Notes (Signed)
[facility] PROGRESS NOTE  Ashok CroonMelissa B Shumard ZOX:[id number] DOB: 13-Feb-1966 DOA: 02/23/2014 PCP: No primary provider on file.  Assessment/Plan: 1. Hypertensive Urgency. -Patient presenting with systolic blood pressures in the 200s. She reported skipping a few doses of her blood pressure medications at home -Continue atenolol 50 mg by mouth daily, amlodipine 10 mg daily and lisinopril 20 mg by mouth daily -Systolic blood pressures in the 140s to 150s this afternoon  2. Hypokalemia -Present with a potassium of 2.4, improving to 3.3. Will provide K. Dur 40 mEq by mouth x2  3. Flu syndrome -Patient complaining of generalized malaise, myalgias, cough, congestion -Will check a flu swab, continue supportive care. Remains afebrile  4. Hypothyroidism -TSH elevated at 10.4, with Free T4 of 0.77 -Will increase synthroid to 125mcg PO q daily  Code Status: Full Code Family Communication: I spoke with patient's husband at bedside Disposition Plan: Continue supportive care    HPI/Subjective: Patient reports ongoing cough, congestion, generalized weakness, malaise. Blood pressures better controlled today.  Objective: Filed Vitals:   02/24/14 1430  BP: 152/99  Pulse: 67  Temp: 98.4 F (36.9 C)  Resp: 18    Intake/Output Summary (Last 24 hours) at 02/24/14 1540 Last data filed at 02/24/14 1040  Gross per 24 hour  Intake    720 ml  Output    650 ml  Net     70 ml   Filed Weights   02/23/14 1423 02/23/14 1902 02/24/14 0500  Weight: 90.719 kg (200 lb) 88.27 kg (194 lb 9.6 oz) 88.3 kg (194 lb 10.7 oz)    Exam:   General:  No acute distress, states feeling well.    Cardiovascular: regular rate and rhythm, normal S1S2  Respiratory: normal inspiratory effort, lungs clear to auscultation  Abdomen: Soft nontender, nondistended  Musculoskeletal: no edema  Data Reviewed: Basic Metabolic Panel:  Recent Labs Lab 02/23/14 1445 02/23/14 2120 02/24/14 0547  NA 141 138  140  K 2.4* 2.6* 3.3*  CL 96 95* 98  CO2 29 30 28   GLUCOSE 122* 121* 100*  BUN 7 8 7   CREATININE 0.60 0.61 0.60  CALCIUM 10.0 9.4 9.7  MG  --  2.0  --    Liver Function Tests:  Recent Labs Lab 02/23/14 1445 02/23/14 2120  AST 13 11  ALT 9 7  ALKPHOS 115 104  BILITOT 0.2* 0.2*  PROT 7.7 7.2  ALBUMIN 3.8 3.3*   No results found for this basename: LIPASE, AMYLASE,  in the last 168 hours No results found for this basename: AMMONIA,  in the last 168 hours CBC:  Recent Labs Lab 02/23/14 1445 02/24/14 0614  WBC 12.1* 9.1  NEUTROABS 9.9* 6.6  HGB 14.6 13.7  HCT 42.0 40.4  MCV 94.8 96.2  PLT 417* 383   Cardiac Enzymes:  Recent Labs Lab 02/23/14 1445  TROPONINI <0.30   BNP (last 3 results) No results found for this basename: PROBNP,  in the last 8760 hours CBG: No results found for this basename: GLUCAP,  in the last 168 hours  No results found for this or any previous visit (from the past 240 hour(s)).   Studies: No results found.  Scheduled Meds: . amLODipine  10 mg Oral Daily  . atenolol  50 mg Oral Daily  . citalopram  20 mg Oral Daily  . enoxaparin (LOVENOX) injection  40 mg Subcutaneous Q24H  . fluticasone  1 spray Each Nare Daily  . levothyroxine  100 mcg Oral QAC breakfast  .  lisinopril  20 mg Oral Daily  . polyethylene glycol  17 g Oral Daily  . potassium chloride  40 mEq Oral Q6H  . sodium chloride  3 mL Intravenous Q12H   Continuous Infusions: . sodium chloride 100 mL/hr at 02/24/14 1423    Principal Problem:   Hypertensive urgency Active Problems:   Hypothyroidism   Hypokalemia   Fatigue   Nasal congestion    Time spent: 35 min    Jeralyn BennettEzequiel Shoshana Johal  [facility] Pager [phone]. If 7PM-7AM, please contact night-coverage at [email or web address], password [email or web address] 02/24/2014, 3:40 PM  LOS: 1 day

## 2014-02-24 NOTE — Progress Notes (Signed)
Patient with elevated BP this am 212/111, RN recheck manual 180/100. Patient complaining of headache and "not feeling good." Given 10mg  prn hydralazine. Notified Claiborne Billingsallahan, NP who stated to recheck BP in 30 minutes and notify if no improvement in patient BP.

## 2014-02-25 DIAGNOSIS — R5383 Other fatigue: Secondary | ICD-10-CM

## 2014-02-25 DIAGNOSIS — E039 Hypothyroidism, unspecified: Secondary | ICD-10-CM

## 2014-02-25 DIAGNOSIS — E876 Hypokalemia: Secondary | ICD-10-CM

## 2014-02-25 DIAGNOSIS — I10 Essential (primary) hypertension: Secondary | ICD-10-CM

## 2014-02-25 DIAGNOSIS — I517 Cardiomegaly: Secondary | ICD-10-CM

## 2014-02-25 DIAGNOSIS — R5381 Other malaise: Secondary | ICD-10-CM

## 2014-02-25 LAB — BASIC METABOLIC PANEL
BUN: 5 mg/dL — AB (ref 6–23)
CHLORIDE: 101 meq/L (ref 96–112)
CO2: 25 mEq/L (ref 19–32)
Calcium: 9.4 mg/dL (ref 8.4–10.5)
Creatinine, Ser: 0.54 mg/dL (ref 0.50–1.10)
GFR calc Af Amer: >90 mL/min (ref >90)
GFR calc non Af Amer: >90 mL/min (ref >90)
Glucose, Bld: 110 mg/dL — ABNORMAL HIGH (ref 70–99)
Potassium: 3.3 mEq/L — ABNORMAL LOW (ref 3.7–5.3)
Sodium: 141 mEq/L (ref 137–147)

## 2014-02-25 LAB — CBC
HEMATOCRIT: 44.2 % (ref 36.0–46.0)
HEMOGLOBIN: 15.1 g/dL — AB (ref 12.0–15.0)
MCH: 33 pg (ref 26.0–34.0)
MCHC: 34.2 g/dL (ref 30.0–36.0)
MCV: 96.7 fL (ref 78.0–100.0)
Platelets: 453 10*3/uL — ABNORMAL HIGH (ref 150–400)
RBC: 4.57 MIL/uL (ref 3.87–5.11)
RDW: 12 % (ref 11.5–15.5)
WBC: 10.3 10*3/uL (ref 4.0–10.5)

## 2014-02-25 LAB — MONONUCLEOSIS SCREEN: MONO SCREEN: NEGATIVE

## 2014-02-25 MED ORDER — HYDRALAZINE HCL 20 MG/ML IJ SOLN
20.0000 mg | INTRAMUSCULAR | Status: DC | PRN
  Administered 2014-02-26: 20 mg via INTRAVENOUS
  Filled 2014-02-25: qty 1

## 2014-02-25 MED ORDER — POTASSIUM CHLORIDE 20 MEQ/15ML (10%) PO LIQD
40.0000 meq | Freq: Four times a day (QID) | ORAL | Status: DC
  Administered 2014-02-25: 40 meq via ORAL
  Filled 2014-02-25 (×3): qty 30

## 2014-02-25 MED ORDER — AZITHROMYCIN 500 MG PO TABS
500.0000 mg | ORAL_TABLET | ORAL | Status: DC
  Filled 2014-02-25: qty 1

## 2014-02-25 MED ORDER — ZOLPIDEM TARTRATE 5 MG PO TABS
5.0000 mg | ORAL_TABLET | Freq: Once | ORAL | Status: AC
  Administered 2014-02-26: 5 mg via ORAL
  Filled 2014-02-25: qty 1

## 2014-02-25 MED ORDER — POTASSIUM CHLORIDE 10 MEQ/100ML IV SOLN
10.0000 meq | INTRAVENOUS | Status: AC
  Administered 2014-02-25 (×3): 10 meq via INTRAVENOUS
  Filled 2014-02-25 (×3): qty 100

## 2014-02-25 MED ORDER — POTASSIUM CHLORIDE CRYS ER 20 MEQ PO TBCR
40.0000 meq | EXTENDED_RELEASE_TABLET | Freq: Four times a day (QID) | ORAL | Status: DC
  Filled 2014-02-25: qty 2

## 2014-02-25 MED ORDER — MAGIC MOUTHWASH W/LIDOCAINE
10.0000 mL | Freq: Four times a day (QID) | ORAL | Status: DC | PRN
  Administered 2014-02-25 – 2014-02-26 (×4): 10 mL via ORAL
  Filled 2014-02-25 (×4): qty 10

## 2014-02-25 MED ORDER — LISINOPRIL 40 MG PO TABS
40.0000 mg | ORAL_TABLET | Freq: Every day | ORAL | Status: DC
  Administered 2014-02-25 – 2014-02-26 (×2): 40 mg via ORAL
  Filled 2014-02-25 (×2): qty 1

## 2014-02-25 MED ORDER — HYDRALAZINE HCL 20 MG/ML IJ SOLN
10.0000 mg | Freq: Once | INTRAMUSCULAR | Status: AC
  Administered 2014-02-25: 10 mg via INTRAVENOUS
  Filled 2014-02-25: qty 1

## 2014-02-25 MED ORDER — CLONIDINE HCL 0.1 MG PO TABS
0.1000 mg | ORAL_TABLET | Freq: Two times a day (BID) | ORAL | Status: DC
  Administered 2014-02-25 – 2014-02-26 (×3): 0.1 mg via ORAL
  Filled 2014-02-25 (×4): qty 1

## 2014-02-25 NOTE — Progress Notes (Signed)
  Echocardiogram 2D Echocardiogram has been performed.  Susan Parsons 02/25/2014, 11:15 AM

## 2014-02-25 NOTE — Progress Notes (Signed)
UR Completed.  Caylin Nass Jane Devota Viruet [phone] 02/25/2014  

## 2014-02-25 NOTE — Progress Notes (Signed)
Patient with BP of 193/114 this am. Gave patient 10mg  hydralazine IV  prn at 0446. Notified Claiborne Billingsallahan, NP. Rechecked BP 180/90. Claiborne Billingsallahan, NP notified. Order given for a one time dose of 10mg  hydralazine IV and NP changed prn order to hydralazine 20mg  IV. One time dose administered at 0600, will recheck and continue to monitor.

## 2014-02-25 NOTE — Progress Notes (Signed)
Pt requesting sleep medication. Schorr, NP put in order for Ambien. Will continue to monitor. Nelda MarseilleJenny Thacker, RN

## 2014-02-25 NOTE — Progress Notes (Signed)
[facility] PROGRESS NOTE  Ashok CroonMelissa B Piehl ZOX:[id number] DOB: [birth year] DOA: 02/23/2014 PCP: No primary provider on file.  Assessment/Plan: 1. Hypertensive Urgency. -Patient presenting with systolic blood pressures in the 200s. She reported skipping a few doses of her blood pressure medications at home -Continue atenolol 50 mg by mouth daily, amlodipine 10 mg daily and lisinopril 20 mg by mouth daily -Clonidine 0.1 mg added, blood pressures improving  2. Hypokalemia -Present with a potassium of 2.4, improving to 3.3. Will provide IV Potassium  3. Flu syndrome -Patient complaining of generalized malaise, myalgias, cough, congestion -Will check a flu swab, continue supportive care. Remains afebrile  5. Peritonsillar Exudates -Patient having significant exudates on exam, Mono spot negative -Will check a soft tissue CT of neck to assess for peritonsillar abscess.   6. Hypothyroidism -TSH elevated at 10.4, with Free T4 of 0.77 -Synthroid increased to 125mcg PO q daily  Code Status: Full Code Family Communication: I spoke with patient's husband at bedside Disposition Plan: Continue supportive care    HPI/Subjective: Patient reports ongoing cough, congestion, generalized weakness, malaise. Blood pressures better controlled today.  Objective: Filed Vitals:   02/25/14 1436  BP: 127/78  Pulse: 75  Temp: 99.1 F (37.3 C)  Resp: 18    Intake/Output Summary (Last 24 hours) at 02/25/14 1835 Last data filed at 02/25/14 1437  Gross per 24 hour  Intake   1080 ml  Output      0 ml  Net   1080 ml   Filed Weights   02/23/14 1902 02/24/14 0500 02/25/14 0433  Weight: 88.27 kg (194 lb 9.6 oz) 88.3 kg (194 lb 10.7 oz) 88.3 kg (194 lb 10.7 oz)    Exam:   General:  No acute distress, states feeling well.    HEENT: Patient having peritonsillar exudates on exam with associated erythema, unchanged since yesterday's exam  Cardiovascular: regular rate and rhythm, normal  S1S2  Respiratory: normal inspiratory effort, lungs clear to auscultation  Abdomen: Soft nontender, nondistended  Musculoskeletal: no edema  Data Reviewed: Basic Metabolic Panel:  Recent Labs Lab 02/23/14 1445 02/23/14 2120 02/24/14 0547 02/25/14 0520  NA 141 138 140 141  K 2.4* 2.6* 3.3* 3.3*  CL 96 95* 98 101  CO2 29 30 28 25   GLUCOSE 122* 121* 100* 110*  BUN 7 8 7  5*  CREATININE 0.60 0.61 0.60 0.54  CALCIUM 10.0 9.4 9.7 9.4  MG  --  2.0  --   --    Liver Function Tests:  Recent Labs Lab 02/23/14 1445 02/23/14 2120  AST 13 11  ALT 9 7  ALKPHOS 115 104  BILITOT 0.2* 0.2*  PROT 7.7 7.2  ALBUMIN 3.8 3.3*   No results found for this basename: LIPASE, AMYLASE,  in the last 168 hours No results found for this basename: AMMONIA,  in the last 168 hours CBC:  Recent Labs Lab 02/23/14 1445 02/24/14 0614 02/25/14 0520  WBC 12.1* 9.1 10.3  NEUTROABS 9.9* 6.6  --   HGB 14.6 13.7 15.1*  HCT 42.0 40.4 44.2  MCV 94.8 96.2 96.7  PLT 417* 383 453*   Cardiac Enzymes:  Recent Labs Lab 02/23/14 1445  TROPONINI <0.30   BNP (last 3 results) No results found for this basename: PROBNP,  in the last 8760 hours CBG: No results found for this basename: GLUCAP,  in the last 168 hours  Recent Results (from the past 240 hour(s))  RAPID STREP SCREEN     Status: None  Collection Time    02/24/14  7:42 PM      Result Value Ref Range Status   Streptococcus, Group A Screen (Direct) NEGATIVE  NEGATIVE Final   Comment: (NOTE)     A Rapid Antigen test may result negative if the antigen level in the     sample is below the detection level of this test. The FDA has not     cleared this test as a stand-alone test therefore the rapid antigen     negative result has reflexed to a Group A Strep culture.     Studies: No results found.  Scheduled Meds: . amLODipine  10 mg Oral Daily  . atenolol  50 mg Oral Daily  . citalopram  20 mg Oral Daily  . cloNIDine  0.1 mg Oral BID   . enoxaparin (LOVENOX) injection  40 mg Subcutaneous Q24H  . fluticasone  1 spray Each Nare Daily  . levothyroxine  125 mcg Oral QAC breakfast  . lisinopril  40 mg Oral Daily  . polyethylene glycol  17 g Oral Daily  . sodium chloride  3 mL Intravenous Q12H   Continuous Infusions: . sodium chloride 100 mL/hr (02/25/14 1601)    Principal Problem:   Hypertensive urgency Active Problems:   Hypothyroidism   Hypokalemia   Fatigue   Nasal congestion    Time spent: 35 min    Jeralyn BennettEzequiel Alton Bouknight  [facility] Pager [phone]. If 7PM-7AM, please contact night-coverage at [email or web address], password [email or web address] 02/25/2014, 6:35 PM  LOS: 2 days

## 2014-02-26 ENCOUNTER — Encounter (HOSPITAL_COMMUNITY): Payer: Self-pay

## 2014-02-26 ENCOUNTER — Inpatient Hospital Stay (HOSPITAL_COMMUNITY): Payer: Self-pay

## 2014-02-26 DIAGNOSIS — J029 Acute pharyngitis, unspecified: Secondary | ICD-10-CM | POA: Diagnosis present

## 2014-02-26 LAB — BASIC METABOLIC PANEL
BUN: 5 mg/dL — ABNORMAL LOW (ref 6–23)
CHLORIDE: 100 meq/L (ref 96–112)
CO2: 25 mEq/L (ref 19–32)
CREATININE: 0.53 mg/dL (ref 0.50–1.10)
Calcium: 9.3 mg/dL (ref 8.4–10.5)
GFR calc non Af Amer: >90 mL/min (ref >90)
Glucose, Bld: 108 mg/dL — ABNORMAL HIGH (ref 70–99)
POTASSIUM: 3.7 meq/L (ref 3.7–5.3)
SODIUM: 140 meq/L (ref 137–147)

## 2014-02-26 LAB — CULTURE, GROUP A STREP

## 2014-02-26 LAB — CBC
HCT: 41.3 % (ref 36.0–46.0)
Hemoglobin: 14.3 g/dL (ref 12.0–15.0)
MCH: 33.3 pg (ref 26.0–34.0)
MCHC: 34.6 g/dL (ref 30.0–36.0)
MCV: 96 fL (ref 78.0–100.0)
PLATELETS: 385 10*3/uL (ref 150–400)
RBC: 4.3 MIL/uL (ref 3.87–5.11)
RDW: 12 % (ref 11.5–15.5)
WBC: 9.3 10*3/uL (ref 4.0–10.5)

## 2014-02-26 LAB — HIV ANTIBODY (ROUTINE TESTING W REFLEX): HIV: NONREACTIVE

## 2014-02-26 MED ORDER — IOHEXOL 300 MG/ML  SOLN
100.0000 mL | Freq: Once | INTRAMUSCULAR | Status: AC | PRN
  Administered 2014-02-26: 75 mL via INTRAVENOUS

## 2014-02-26 MED ORDER — TRAMADOL HCL 50 MG PO TABS: 50.0000 mg | ORAL_TABLET | Freq: Two times a day (BID) | ORAL | Status: DC | PRN

## 2014-02-26 MED ORDER — CLONIDINE HCL 0.2 MG PO TABS
0.2000 mg | ORAL_TABLET | Freq: Once | ORAL | Status: DC
  Filled 2014-02-26: qty 1

## 2014-02-26 MED ORDER — AMOXICILLIN-POT CLAVULANATE 875-125 MG PO TABS: 1.0000 | ORAL_TABLET | Freq: Two times a day (BID) | ORAL | Status: DC

## 2014-02-26 MED ORDER — CLONIDINE HCL 0.1 MG PO TABS: 0.1000 mg | ORAL_TABLET | Freq: Three times a day (TID) | ORAL | Status: DC

## 2014-02-26 MED ORDER — LISINOPRIL 40 MG PO TABS: 40.0000 mg | ORAL_TABLET | Freq: Every day | ORAL | Status: DC

## 2014-02-26 MED ORDER — AMOXICILLIN-POT CLAVULANATE 875-125 MG PO TABS
1.0000 | ORAL_TABLET | Freq: Two times a day (BID) | ORAL | Status: DC
  Administered 2014-02-26: 1 via ORAL
  Filled 2014-02-26 (×2): qty 1

## 2014-02-26 MED ORDER — LEVOTHYROXINE SODIUM 125 MCG PO TABS: 125.0000 ug | ORAL_TABLET | Freq: Every day | ORAL | Status: DC

## 2014-02-26 NOTE — Discharge Summary (Signed)
Physician Discharge Summary  Susan CroonMelissa B Quiett ZHY:[id number] DOB: 12-May-1966 DOA: 02/23/2014  PCP: No primary provider on file.  Admit date: 02/23/2014 Discharge date: 02/26/2014  Time spent: 35 minutes  Recommendations for Outpatient Follow-up:  1. Please follow up on Blood Pressures, she was admitted for hypertensive urgency. Clonidine 0.1 mg PO TID added to her regimen, as her lisinopril was increased to 40mg  PO q daily.  2. Please followup on repeat TSH, she was found to have a TSH of 10.4 with a free T4 of 0.77 on this hospitalization. Her Synthroid was increased to 125 mcg by mouth daily from 100 mcg by mouth daily   Discharge Diagnoses:  Principal Problem:   Hypertensive urgency Active Problems:   Hypothyroidism   Hypokalemia   Fatigue   Nasal congestion   Pharyngitis   Discharge Condition: Stable  Diet recommendation: Heart Healthy  Filed Weights   02/24/14 0500 02/25/14 0433 02/26/14 0425  Weight: 88.3 kg (194 lb 10.7 oz) 88.3 kg (194 lb 10.7 oz) 88.3 kg (194 lb 10.7 oz)    History of present illness:  Susan Parsons is a 48 y.o. female with Past medical history of hypertension and hypothyroidism.  The patient presented with complaints of generalized fatigue. She mentions that since last 2 days she has been having complaints of generalized fatigue and tiredness this was associated with dizziness. She describes her dizziness as location of the lightheadedness and occasionally room spinning around. Her dizziness primarily arises when she changes position. She mentions when she is lying down without moving her head she does not have any dizziness. With that she also had some nausea today. She complains of frontal headache and stuffed nose that is ongoing since last 2 days. She denies any blurring of vision. She complains of sore throat without any cough. She denies any fever chills chest pain shortness of breath chest tightness. She denies any abdominal pain. She complains  of constipation since last for 5 days. She denies any burning urination or bleeding anywhere. She denies any leg swelling orthopnea or PND.  She has history of hypertension in her brother who is 6035, her mother her maternal grandmother and grandfather.  The patient is coming from home. And at her baseline independent for most of her ADL.  Hospital Course:  Patient is a pleasant 48 year old female with a past medical history of hypertension and hypothyroidism, admitted to the medicine service on 02/23/2014. She presented with complaints of fatigue and elevated blood pressures. Patient had also complained of nasal congestion, sore throat, dizziness, malaise, generalized weakness. On presentation she was found to have systolic blood pressures over 295200. She reported missing a few doses of her antihypertensive agents prior to admission. Lab work revealed the presence of hypokalemia having a potassium of 2.4. Patient was admitted to telemetry administered IV hydralazine, with the upward titration of her oral antihypertensive agents. She was administered potassium. Blood pressures improved meanwhile patient continued to complain of generalized weakness, malaise, sore throat and congestion. On exam she had peritonsillar exudates. This was further worked up with a Monospot and HIV I came back negative. I was concerned about the possibility of a peritonsillar abscess for which a soft tissue CT scan of neck was checked. This was negative for abscess or airway compromise. Patient did show gradual clinical improvement. By 02/26/2014 her potassium had improved to 3.7. She reported some improvement to her sore throat, was tolerating by mouth intake. Other issues addressed during this hospitalization include hypothyroidism. Check complaining of  generalized weakness, fatigue, poor tolerance to physical exertion. TSH, back elevated at 10.4. Her Synthroid was increased from 100 mcg to 125 mcg by mouth daily. She was discharged in  stable condition on 02/26/2014. Clonidine 0.1 mg by mouth 3 times a day added to her regimen, lisinopril increased to 40 mg by mouth daily, please followup on blood pressures. She was instructed to check her blood pressures daily and report these to her primary care provider.   Discharge Exam: Filed Vitals:   02/26/14 1419  BP: 109/55  Pulse: 75  Temp:   Resp:     General: Patient appears improved, tolerating by mouth intake Cardiovascular: Regular rate and rhythm normal S1-S2 no murmurs rubs or gallops Respiratory: Clear to auscultation bilaterally Abdomen: Soft nontender nondistended  Discharge Instructions You were cared for by a hospitalist during your hospital stay. If you have any questions about your discharge medications or the care you received while you were in the hospital after you are discharged, you can call the unit and asked to speak with the hospitalist on call if the hospitalist that took care of you is not available. Once you are discharged, your primary care physician will handle any further medical issues. Please note that NO REFILLS for any discharge medications will be authorized once you are discharged, as it is imperative that you return to your primary care physician (or establish a relationship with a primary care physician if you do not have one) for your aftercare needs so that they can reassess your need for medications and monitor your lab values.      Discharge Orders   Future Appointments Provider Department Dept Phone   03/11/2014 2:30 PM [facility] Financial Counselor [facility] [phone]   03/11/2014 3:30 PM [facility] Covering Provider [facility] [phone]   Future Orders Complete By Expires   Call MD for:  difficulty breathing, headache or visual disturbances  As directed    Call MD for:  extreme fatigue  As directed    Call MD for:  persistant dizziness or light-headedness  As directed     Call MD for:  persistant nausea and vomiting  As directed    Call MD for:  temperature >100.4  As directed    Diet - low sodium heart healthy  As directed    Discharge instructions  As directed    Increase activity slowly  As directed        Medication List    STOP taking these medications       atenolol 50 MG tablet  Commonly known as:  TENORMIN     lisinopril-hydrochlorothiazide 20-12.5 MG per tablet  Commonly known as:  PRINZIDE,ZESTORETIC      TAKE these medications       amLODipine 10 MG tablet  Commonly known as:  NORVASC  Take 10 mg by mouth daily.     amLODipine 10 MG tablet  Commonly known as:  NORVASC  Take 10 mg by mouth daily.     amoxicillin-clavulanate 875-125 MG per tablet  Commonly known as:  AUGMENTIN  Take 1 tablet by mouth 2 (two) times daily.     citalopram 20 MG tablet  Commonly known as:  CELEXA  Take 20 mg by mouth daily.     cloNIDine 0.1 MG tablet  Commonly known as:  CATAPRES  Take 1 tablet (0.1 mg total) by mouth 3 (three) times daily.     HYDROcodone-acetaminophen 10-325 MG per tablet  Commonly known as:  NORCO  Take 1 tablet by mouth 3 (three) times daily as needed for severe pain.     labetalol 100 MG tablet  Commonly known as:  NORMODYNE  Take 100 mg by mouth 2 (two) times daily.     levothyroxine 125 MCG tablet  Commonly known as:  SYNTHROID, LEVOTHROID  Take 1 tablet (125 mcg total) by mouth daily before breakfast.     lisinopril 40 MG tablet  Commonly known as:  PRINIVIL,ZESTRIL  Take 1 tablet (40 mg total) by mouth daily.     traMADol 50 MG tablet  Commonly known as:  ULTRAM  Take 1 tablet (50 mg total) by mouth every 12 (twelve) hours as needed for severe pain.       No Known Allergies Follow-up Information   Follow up with [facility] On 03/11/2014. (2:30 for orange card application and 3:30 for hospital follow up )    Contact information:   [address] [place]  [address] [phone]       The results of significant diagnostics from this hospitalization (including imaging, microbiology, ancillary and laboratory) are listed below for reference.    Significant Diagnostic Studies: Ct Soft Tissue Neck W Contrast  02/26/2014   CLINICAL DATA:  Assess for peritonsillar abscess.  Severe headache.  EXAM: CT NECK WITH CONTRAST  TECHNIQUE: Multidetector CT imaging of the neck was performed using the standard protocol following the bolus administration of intravenous contrast.  CONTRAST:  75mL OMNIPAQUE IOHEXOL 300 MG/ML  SOLN  COMPARISON:  CT HEAD W/O CM dated 09/02/2011  FINDINGS: Aerodigestive tract is normal. Bilateral palatine tonsilloliths consistent with remote infectious or inflammatory process, no free fluid, focal fluid collections, nor abscess. Preservation of the parapharyngeal fat tissue planes.  Major salivary glands are unremarkable. Normal thyroid gland. No lymphadenopathy by CT size criteria. Normal appearance of cervical vessels.  Straightened cervical lordosis with moderate C5-6 and C6-7 degenerative disc disease with ventral spurring. Mild paranasal sinus mucosal thickening without air-fluid levels. Mastoid air cells are well aerated.  IMPRESSION: No acute process within the neck, specifically no peritonsillar abscess nor airway compromise.   Electronically Signed   By: Awilda Metro   On: 02/26/2014 02:06    Microbiology: Recent Results (from the past 240 hour(s))  RAPID STREP SCREEN     Status: None   Collection Time    02/24/14  7:42 PM      Result Value Ref Range Status   Streptococcus, Group A Screen (Direct) NEGATIVE  NEGATIVE Final   Comment: (NOTE)     A Rapid Antigen test may result negative if the antigen level in the     sample is below the detection level of this test. The FDA has not     cleared this test as a stand-alone test therefore the rapid antigen     negative result has reflexed to a Group A Strep culture.  CULTURE,  GROUP A STREP     Status: None   Collection Time    02/24/14  7:42 PM      Result Value Ref Range Status   Specimen Description THROAT   Final   Special Requests NONE   Final   Culture     Final   Value: No Beta Hemolytic Streptococci Isolated     Performed at [facility]   Report Status 02/26/2014 FINAL   Final     Labs: Basic Metabolic Panel:  Recent Labs  Lab 02/23/14 1445 02/23/14 2120 02/24/14 0547 02/25/14 0520 02/26/14 0715  NA 141 138 140 141 140  K 2.4* 2.6* 3.3* 3.3* 3.7  CL 96 95* 98 101 100  CO2 29 30 28 25 25   GLUCOSE 122* 121* 100* 110* 108*  BUN 7 8 7  5* 5*  CREATININE 0.60 0.61 0.60 0.54 0.53  CALCIUM 10.0 9.4 9.7 9.4 9.3  MG  --  2.0  --   --   --    Liver Function Tests:  Recent Labs Lab 02/23/14 1445 02/23/14 2120  AST 13 11  ALT 9 7  ALKPHOS 115 104  BILITOT 0.2* 0.2*  PROT 7.7 7.2  ALBUMIN 3.8 3.3*   No results found for this basename: LIPASE, AMYLASE,  in the last 168 hours No results found for this basename: AMMONIA,  in the last 168 hours CBC:  Recent Labs Lab 02/23/14 1445 02/24/14 0614 02/25/14 0520 02/26/14 0715  WBC 12.1* 9.1 10.3 9.3  NEUTROABS 9.9* 6.6  --   --   HGB 14.6 13.7 15.1* 14.3  HCT 42.0 40.4 44.2 41.3  MCV 94.8 96.2 96.7 96.0  PLT 417* 383 453* 385   Cardiac Enzymes:  Recent Labs Lab 02/23/14 1445  TROPONINI <0.30   BNP: BNP (last 3 results) No results found for this basename: PROBNP,  in the last 8760 hours CBG: No results found for this basename: GLUCAP,  in the last 168 hours     Signed:  Jeralyn BennettEzequiel Jhaniya Briski  [facility] 02/26/2014, 4:24 PM

## 2014-02-26 NOTE — Progress Notes (Signed)
Nsg Discharge Note  Admit Date:  02/23/2014 Discharge date: 02/26/2014   Susan Parsons to be D/C'd Home per MD order.  AVS completed.  Copy for chart, and copy for patient signed, and dated. Patient/caregiver able to verbalize understanding.  Discharge Medication:   Medication List    STOP taking these medications       atenolol 50 MG tablet  Commonly known as:  TENORMIN     HYDROcodone-acetaminophen 10-325 MG per tablet  Commonly known as:  NORCO     lisinopril-hydrochlorothiazide 20-12.5 MG per tablet  Commonly known as:  PRINZIDE,ZESTORETIC      TAKE these medications       amLODipine 10 MG tablet  Commonly known as:  NORVASC  Take 10 mg by mouth daily.     amLODipine 10 MG tablet  Commonly known as:  NORVASC  Take 10 mg by mouth daily.     amoxicillin-clavulanate 875-125 MG per tablet  Commonly known as:  AUGMENTIN  Take 1 tablet by mouth 2 (two) times daily.     citalopram 20 MG tablet  Commonly known as:  CELEXA  Take 20 mg by mouth daily.     cloNIDine 0.1 MG tablet  Commonly known as:  CATAPRES  Take 1 tablet (0.1 mg total) by mouth 3 (three) times daily.     labetalol 100 MG tablet  Commonly known as:  NORMODYNE  Take 100 mg by mouth 2 (two) times daily.     levothyroxine 125 MCG tablet  Commonly known as:  SYNTHROID, LEVOTHROID  Take 1 tablet (125 mcg total) by mouth daily before breakfast.     lisinopril 40 MG tablet  Commonly known as:  PRINIVIL,ZESTRIL  Take 1 tablet (40 mg total) by mouth daily.     traMADol 50 MG tablet  Commonly known as:  ULTRAM  Take 1 tablet (50 mg total) by mouth every 12 (twelve) hours as needed for severe pain.        Discharge Assessment: Filed Vitals:   02/26/14 1419  BP: 109/55  Pulse: 75  Temp:   Resp:    Skin clean, dry and intact without evidence of skin break down, no evidence of skin tears noted. IV catheter discontinued intact. Site without signs and symptoms of complications - no redness or  edema noted at insertion site, patient denies c/o pain - only slight tenderness at site.  Dressing with slight pressure applied.  D/c Instructions-Education: Discharge instructions given to patient/family with verbalized understanding. D/c education completed with patient/family including follow up instructions, medication list, d/c activities limitations if indicated, with other d/c instructions as indicated by MD - patient able to verbalize understanding, all questions fully answered. Patient instructed to return to ED, call 911, or call MD for any changes in condition.  Patient escorted via WC, and D/C home via private auto.  Kern ReapKatie L Marcella Dunnaway, RN 02/26/2014 7:03 PM

## 2014-03-11 ENCOUNTER — Inpatient Hospital Stay: Payer: Self-pay

## 2014-03-11 ENCOUNTER — Ambulatory Visit: Payer: Self-pay

## 2014-03-27 ENCOUNTER — Telehealth: Payer: Self-pay

## 2014-03-27 ENCOUNTER — Telehealth: Payer: Self-pay | Admitting: General Practice

## 2014-03-27 ENCOUNTER — Inpatient Hospital Stay: Payer: Self-pay | Admitting: Internal Medicine

## 2014-03-27 NOTE — Telephone Encounter (Signed)
Left voicemail for patient to call to schedule appointment to establish care. °

## 2014-04-01 NOTE — Telephone Encounter (Signed)
Contacts       Type Contact Phone    03/27/2014 10:26 AM Phone (Incoming) [place], [place] (Self) [phone] (H)    Pt would like to set up an Appointment to Est.Care/Hospital F/U Visit. Due to HTN.Pt may be reached at # listed above

## 2014-04-25 ENCOUNTER — Encounter: Payer: Self-pay | Admitting: Internal Medicine

## 2014-04-25 ENCOUNTER — Ambulatory Visit (INDEPENDENT_AMBULATORY_CARE_PROVIDER_SITE_OTHER): Payer: Self-pay | Admitting: Internal Medicine

## 2014-04-25 VITALS — BP 150/96 | HR 90 | Temp 98.4°F | Resp 20 | Ht 66.0 in | Wt 195.0 lb

## 2014-04-25 DIAGNOSIS — F32A Depression, unspecified: Secondary | ICD-10-CM

## 2014-04-25 DIAGNOSIS — I10 Essential (primary) hypertension: Secondary | ICD-10-CM

## 2014-04-25 DIAGNOSIS — F329 Major depressive disorder, single episode, unspecified: Secondary | ICD-10-CM

## 2014-04-25 DIAGNOSIS — J301 Allergic rhinitis due to pollen: Secondary | ICD-10-CM

## 2014-04-25 DIAGNOSIS — Z23 Encounter for immunization: Secondary | ICD-10-CM

## 2014-04-25 DIAGNOSIS — E876 Hypokalemia: Secondary | ICD-10-CM

## 2014-04-25 DIAGNOSIS — Z0000 Encounter for general adult medical examination without abnormal findings: Secondary | ICD-10-CM

## 2014-04-25 DIAGNOSIS — F3289 Other specified depressive episodes: Secondary | ICD-10-CM

## 2014-04-25 DIAGNOSIS — E038 Other specified hypothyroidism: Secondary | ICD-10-CM

## 2014-04-25 MED ORDER — LEVOTHYROXINE SODIUM 125 MCG PO TABS: 125.0000 ug | ORAL_TABLET | Freq: Every day | ORAL | Status: DC

## 2014-04-25 MED ORDER — LISINOPRIL 40 MG PO TABS: 40.0000 mg | ORAL_TABLET | Freq: Every day | ORAL | Status: DC

## 2014-04-25 MED ORDER — CITALOPRAM HYDROBROMIDE 20 MG PO TABS: 20.0000 mg | ORAL_TABLET | Freq: Every day | ORAL | Status: DC

## 2014-04-25 MED ORDER — CLONIDINE HCL 0.1 MG PO TABS: 0.1000 mg | ORAL_TABLET | Freq: Two times a day (BID) | ORAL | Status: DC

## 2014-04-25 MED ORDER — AMLODIPINE BESYLATE 5 MG PO TABS: 5.0000 mg | ORAL_TABLET | Freq: Every day | ORAL | Status: DC

## 2014-04-25 NOTE — Progress Notes (Signed)
Patient ID: Susan Parsons, female   DOB: 1966-03-02, 48 y.o.   MRN: [id number]   Susan Parsons, is a 48 y.o. female  YQM:[id number]  BMW:[id number]  DOB - 1966-03-02  CC:  Chief Complaint  Patient presents with  . [facility]    Post [facility] F/U  . Med refills    Pt states she will need refills on her meds today       HPI: Susan Parsons is a 48 y.o. female here today to establish medical care. She states that in the last 2 months she has tried to be compliant with her medications. She has been checking her BP and it has been in the range of 130/80. She also reports occasional feelings of melancholy regarding the death of her mother less than 6 months ago.  She also complains of allergy symptoms including rhinorrhea, sneezing and occasional nasal stuffiness.  Patient has No headache, No chest pain, No abdominal pain - No Nausea, No new weakness tingling or numbness, No Cough - SOB.  No Known Allergies Past Medical History  Diagnosis Date  . Hypertension   . Thyroid disease   . Depression   . Allergy    Current Outpatient Prescriptions on File Prior to Visit  Medication Sig Dispense Refill  . traMADol (ULTRAM) 50 MG tablet Take 1 tablet (50 mg total) by mouth every 12 (twelve) hours as needed for severe pain.  12 tablet  0   No current facility-administered medications on file prior to visit.   Family History  Problem Relation Age of Onset  . COPD Mother   . Hypertension Mother   . Stroke Mother   . Hypertension Father   . Hypertension Brother    History   Social History  . Marital Status: Single    Spouse Name: Susan Parsons    Number of Children: Susan Parsons  . Years of Education: Susan Parsons   Occupational History  . Not on file.   Social History Main Topics  . Smoking status: Former Smoker -- 1.00 packs/day for 10 years    Quit date: 09/01/2010  . Smokeless tobacco: Not on file  . Alcohol Use: No  . Drug Use: No  . Sexual Activity: Yes    Birth Control/ Protection:  None     Comment: LNMP 04/02/2014   Other Topics Concern  . Not on file   Social History Narrative  . No narrative on file    Review of Systems: Constitutional: Negative for fever, chills, diaphoresis, activity change, appetite change and fatigue. HENT: Negative for ear pain, nosebleeds, congestion, facial swelling, rhinorrhea, neck pain, neck stiffness and ear discharge.  Eyes: Negative for pain, discharge, redness, itching and visual disturbance. Respiratory: Negative for cough, choking, chest tightness, shortness of breath, wheezing and stridor.  Cardiovascular: Negative for chest pain, palpitations and leg swelling. Gastrointestinal: Negative for abdominal distention. Genitourinary: Negative for dysuria, urgency, frequency, hematuria, flank pain, decreased urine volume, difficulty urinating and dyspareunia.  Musculoskeletal: Negative for back pain, joint swelling, arthralgia and gait problem. Neurological: Negative for dizziness, tremors, seizures, syncope, facial asymmetry, speech difficulty, weakness, light-headedness, numbness and headaches.  Hematological: Negative for adenopathy. Does not bruise/bleed easily. Psychiatric/Behavioral: Negative for hallucinations, behavioral problems, confusion, dysphoric mood, decreased concentration and agitation.    Objective:    Filed Vitals:   04/25/14 1101  BP: 150/96  Pulse: 90  Temp: 98.4 F (36.9 C)  Resp: 20    Physical Exam: Constitutional: Patient appears well-developed and well-nourished. No distress. HENT: Normocephalic, atraumatic,  External right and left ear normal. Oropharynx is clear and moist.  Eyes: Conjunctivae and EOM are normal. PERRLA, no scleral icterus. Neck: Normal ROM. Neck supple. No JVD. No tracheal deviation. No thyromegaly. CVS: RRR, S1/S2 +, no murmurs, no gallops, no carotid bruit.  Pulmonary: Effort and breath sounds normal, no stridor, rhonchi, wheezes, rales.  Abdominal: Soft. BS +, no distension,  tenderness, rebound or guarding.  Musculoskeletal: Normal range of motion. No edema and no tenderness.  Lymphadenopathy: No lymphadenopathy noted, cervical, inguinal or axillary Neuro: Alert. Normal reflexes, muscle tone coordination. No cranial nerve deficit. Skin: Skin is warm and dry. No rash noted. Not diaphoretic. No erythema. No pallor. Psychiatric: Normal mood and affect. Behavior, judgment, thought content normal.  Lab Results  Component Value Date   WBC 9.3 02/26/2014   HGB 14.3 02/26/2014   HCT 41.3 02/26/2014   MCV 96.0 02/26/2014   PLT 385 02/26/2014   Lab Results  Component Value Date   CREATININE 0.53 02/26/2014   BUN 5* 02/26/2014   NA 140 02/26/2014   K 3.7 02/26/2014   CL 100 02/26/2014   CO2 25 02/26/2014    No results found for this basename: HGBA1C   Lipid Panel  No results found for this basename: chol, trig, hdl, cholhdl, vldl, ldlcalc       Assessment and plan:   1. Accelerated essential hypertension - BP mildly elevated but patient reports that BP usually 131/84 at home. She wil continue to monitor BP and bring cuff in at next visit to compare readings. I will decrease Clonidine to BID adn add Norvasc 5 mg. Goal is to discontinue Clonidine. - Lipid panel - MM Digital Screening; Future - amLODipine (NORVASC) 5 MG tablet; Take 1 tablet (5 mg total) by mouth daily.  Dispense: 30 tablet; Refill: 1 - lisinopril (PRINIVIL,ZESTRIL) 40 MG tablet; Take 1 tablet (40 mg total) by mouth daily.  Dispense: 30 tablet; Refill: 1 - cloNIDine (CATAPRES) 0.1 MG tablet; Take 1 tablet (0.1 mg total) by mouth 2 (two) times daily.  Dispense: 60 tablet; Refill: 0  2. 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  Dispense: 30 tablet; Refill: 3  3. Other specified hypothyroidism - No signs of dysthyroid states and labs reviewed from [facility] in last 2 months shows normal TSH - levothyroxine  (SYNTHROID, LEVOTHROID) 125 MCG tablet; Take 1 tablet (125 mcg total) by mouth daily before breakfast.  Dispense: 30 tablet; Refill: 11  4. Hypokalemia - Last reading normal   5. Visit for annual health examination - Will obtain labs in anticipation of next visit. - Lipid panel - Vitamin D, 25-hydroxy  6. Allergic rhinitis due to pollen - Recommended Claritin OTC  7. Need for Tdap vaccination Immunization delinquent for TDap - Tdap vaccine greater than or equal to 7yo IM   Return in about 1 month (around 05/25/2014) for HTN, Annual Physical.  The patient was given clear instructions to go to ER or return to medical center if symptoms don't improve, worsen or new problems develop. The patient verbalized understanding. The patient was told to call to get lab results if they haven't heard anything in the next week.     This note has been created with Education officer, environmentalDragon speech recognition software and smart phrase technology. Any transcriptional errors are unintentional.    MATTHEWS,MICHELLE A., MD [facility] [place], [place] [address] [phone]   04/25/2014,  12:02 PM

## 2014-04-26 LAB — LIPID PANEL
CHOL/HDL RATIO: 5.4 ratio
Cholesterol: 167 mg/dL (ref 0–200)
HDL: 31 mg/dL — ABNORMAL LOW (ref >39)
LDL Cholesterol: 108 mg/dL — ABNORMAL HIGH (ref 0–99)
Triglycerides: 141 mg/dL (ref <150)
VLDL: 28 mg/dL (ref 0–40)

## 2014-04-26 LAB — VITAMIN D 25 HYDROXY (VIT D DEFICIENCY, FRACTURES): Vit D, 25-Hydroxy: 26 ng/mL — ABNORMAL LOW (ref 30–89)

## 2014-05-24 ENCOUNTER — Ambulatory Visit: Payer: Self-pay | Admitting: Family Medicine

## 2014-07-16 ENCOUNTER — Other Ambulatory Visit: Payer: Self-pay | Admitting: Internal Medicine

## 2014-07-31 ENCOUNTER — Encounter: Payer: Self-pay | Admitting: Internal Medicine

## 2014-07-31 ENCOUNTER — Emergency Department (HOSPITAL_BASED_OUTPATIENT_CLINIC_OR_DEPARTMENT_OTHER)
Admission: EM | Admit: 2014-07-31 | Discharge: 2014-07-31 | Disposition: A | Discharge status: Home / Self Care | Payer: Self-pay | Attending: Emergency Medicine | Admitting: Emergency Medicine

## 2014-07-31 ENCOUNTER — Emergency Department (HOSPITAL_BASED_OUTPATIENT_CLINIC_OR_DEPARTMENT_OTHER): Payer: Self-pay

## 2014-07-31 ENCOUNTER — Encounter (HOSPITAL_BASED_OUTPATIENT_CLINIC_OR_DEPARTMENT_OTHER): Payer: Self-pay | Admitting: Emergency Medicine

## 2014-07-31 DIAGNOSIS — M7582 Other shoulder lesions, left shoulder: Secondary | ICD-10-CM

## 2014-07-31 DIAGNOSIS — F3289 Other specified depressive episodes: Secondary | ICD-10-CM | POA: Insufficient documentation

## 2014-07-31 DIAGNOSIS — M719 Bursopathy, unspecified: Principal | ICD-10-CM | POA: Insufficient documentation

## 2014-07-31 DIAGNOSIS — Z79899 Other long term (current) drug therapy: Secondary | ICD-10-CM | POA: Insufficient documentation

## 2014-07-31 DIAGNOSIS — E079 Disorder of thyroid, unspecified: Secondary | ICD-10-CM | POA: Insufficient documentation

## 2014-07-31 DIAGNOSIS — M67919 Unspecified disorder of synovium and tendon, unspecified shoulder: Secondary | ICD-10-CM | POA: Insufficient documentation

## 2014-07-31 DIAGNOSIS — M778 Other enthesopathies, not elsewhere classified: Secondary | ICD-10-CM

## 2014-07-31 DIAGNOSIS — I10 Essential (primary) hypertension: Secondary | ICD-10-CM | POA: Insufficient documentation

## 2014-07-31 DIAGNOSIS — F329 Major depressive disorder, single episode, unspecified: Secondary | ICD-10-CM | POA: Insufficient documentation

## 2014-07-31 DIAGNOSIS — M25519 Pain in unspecified shoulder: Secondary | ICD-10-CM | POA: Insufficient documentation

## 2014-07-31 DIAGNOSIS — M2550 Pain in unspecified joint: Secondary | ICD-10-CM | POA: Insufficient documentation

## 2014-07-31 MED ORDER — IBUPROFEN 800 MG PO TABS: 800.0000 mg | ORAL_TABLET | Freq: Three times a day (TID) | ORAL | Status: AC

## 2014-07-31 MED ORDER — HYDROCODONE-ACETAMINOPHEN 5-325 MG PO TABS: 2.0000 | ORAL_TABLET | ORAL | Status: AC | PRN

## 2014-07-31 NOTE — ED Provider Notes (Signed)
Medical screening examination/treatment/procedure(s) were performed by non-physician practitioner and as supervising physician I was immediately available for consultation/collaboration.   EKG Interpretation None        Joya Gaskinsonald W Kashius Dominic, MD 07/31/14 1410

## 2014-07-31 NOTE — ED Notes (Signed)
Patient states she has had left shoulder pain for one week, which is associated with tingling.  No known injury, however, she was playing with her granddaughter and throwing her into the air prior to her shoulder hurting.

## 2014-07-31 NOTE — ED Provider Notes (Signed)
CSN: [id number]     Arrival date & time 07/31/14  1209 History   First MD Initiated Contact with Patient 07/31/14 1232     Chief Complaint  Patient presents with  . Shoulder Pain    left     (Consider location/radiation/quality/duration/timing/severity/associated sxs/prior Treatment) Patient is a 48 y.o. female presenting with shoulder pain. The history is provided by the patient. No language interpreter was used.  Shoulder Pain This is a new problem. The current episode started today. The problem occurs constantly. The problem has been gradually worsening. Associated symptoms include arthralgias. Pertinent negatives include no neck pain. Nothing aggravates the symptoms. She has tried nothing for the symptoms. The treatment provided mild relief.  Pt complains of pain in left shoulder.  Pt has been lifting a car seat and 638 month old baby.    Past Medical History  Diagnosis Date  . Hypertension   . Thyroid disease   . Depression    No past surgical history on file. No family history on file. History  Substance Use Topics  . Smoking status: Not on file  . Smokeless tobacco: Not on file  . Alcohol Use: Not on file   OB History   Grav Para Term Preterm Abortions TAB SAB Ect Mult Living                 Review of Systems  Musculoskeletal: Positive for arthralgias. Negative for neck pain.  All other systems reviewed and are negative.     Allergies  Review of patient's allergies indicates no known allergies.  Home Medications   Prior to Admission medications   Medication Sig Start Date End Date Taking? Authorizing Provider  AmLODIPine Besylate (NORVASC PO) Take by mouth.   Yes Historical Provider, MD  citalopram (CELEXA) 10 MG tablet Take 10 mg by mouth daily.   Yes Historical Provider, MD  levothyroxine (SYNTHROID) 100 MCG tablet Take 100 mcg by mouth daily before breakfast.   Yes Historical Provider, MD   BP 180/115  Pulse 98  Temp(Src) 98.5 F (36.9 C) (Oral)  Resp  20  SpO2 99%  LMP 06/28/2014 Physical Exam  Nursing note and vitals reviewed. Constitutional: She is oriented to person, place, and time. She appears well-developed and well-nourished.  HENT:  Head: Normocephalic.  Eyes: EOM are normal.  Neck: Normal range of motion.  Pulmonary/Chest: Effort normal.  Abdominal: She exhibits no distension.  Musculoskeletal: She exhibits tenderness.  Tender left shoulder diffusely,  Pain with movement abduction and adduction.    Neurological: She is alert and oriented to person, place, and time.  Psychiatric: She has a normal mood and affect.    ED Course  Procedures (including critical care time) Labs Review Labs Reviewed - No data to display  Imaging Review No results found.   EKG Interpretation None      MDM   Final diagnoses:  Left shoulder tendonitis    I suspect rotator cuff tendonitis.   I will treat with ibuprofen, hydrocodone Pt advised to see Dr. Pearletha Forgehudnall for evaluation and treatment.    Lonia SkinnerLeslie K NortonSofia, PA-C 07/31/14 [id number]

## 2014-07-31 NOTE — Discharge Instructions (Signed)
Rotator Cuff Tendinitis  Rotator cuff tendinitis is inflammation of the tough, cord-like bands that connect muscle to bone (tendons) in your rotator cuff. Your rotator cuff is the collection of all the muscles and tendons that connect your arm to your shoulder. Your rotator cuff holds the head of your upper arm bone (humerus) in the cup (fossa) of your shoulder blade (scapula). CAUSES Rotator cuff tendinitis is usually caused by overusing the joint involved.  SIGNS AND SYMPTOMS  Deep ache in the shoulder also felt on the outside upper arm over the shoulder muscle.  Point tenderness over the area that is injured.  Pain comes on gradually and becomes worse with lifting the arm to the side (abduction) or turning it inward (internal rotation).  May lead to a chronic tear: When a rotator cuff tendon becomes inflamed, it runs the risk of losing its blood supply, causing some tendon fibers to die. This increases the risk that the tendon can fray and partially or completely tear. DIAGNOSIS Rotator cuff tendinitis is diagnosed by taking a medical history, performing a physical exam, and reviewing results of imaging exams. The medical history is useful to help determine the type of rotator cuff injury. The physical exam will include looking at the injured shoulder, feeling the injured area, and watching you do range-of-motion exercises. X-ray exams are typically done to rule out other causes of shoulder pain, such as fractures. MRI is the imaging exam usually used for significant shoulder injuries. Sometimes a dye study called CT arthrogram is done, but it is not as widely used as MRI. In some institutions, special ultrasound tests may also be used to aid in the diagnosis. TREATMENT  Less Severe Cases  Use of a sling to rest the shoulder for a short period of time. Prolonged use of the sling can cause stiffness, weakness, and loss of motion of the shoulder joint.  Anti-inflammatory medicines, such as  ibuprofen or naproxen sodium, may be prescribed. More Severe Cases  Physical therapy.  Use of steroid injections into the shoulder joint.  Surgery. HOME CARE INSTRUCTIONS   Use a sling or splint until the pain decreases. Prolonged use of the sling can cause stiffness, weakness, and loss of motion of the shoulder joint.  Apply ice to the injured area:  Put ice in a plastic bag.  Place a towel between your skin and the bag.  Leave the ice on for 20 minutes, 2-3 times a day.  Try to avoid use other than gentle range of motion while your shoulder is painful. Use the shoulder and exercise only as directed by your health care provider. Stop exercises or range of motion if pain or discomfort increases, unless directed otherwise by your health care provider.  Only take over-the-counter or prescription medicines for pain, discomfort, or fever as directed by your health care provider.  If you were given a shoulder sling and straps (immobilizer), do not remove it except as directed, or until you see a health care provider for a follow-up exam. If you need to remove it, move your arm as little as possible or as directed.  You may want to sleep on several pillows at night to lessen swelling and pain. SEEK IMMEDIATE MEDICAL CARE IF:   Your shoulder pain increases or new pain develops in your arm, hand, or fingers and is not relieved with medicines.  You have new, unexplained symptoms, especially increased numbness in the hands or loss of strength.  You develop any worsening of the problems   that brought you in for care.  Your arm, hand, or fingers are numb or tingling.  Your arm, hand, or fingers are swollen, painful, or turn white or blue. MAKE SURE YOU:  Understand these instructions.  Will watch your condition.  Will get help right away if you are not doing well or get worse. Document Released: 01/08/2004 Document Revised: 08/08/2013 Document Reviewed: 05/30/2013 [employer] Patient  Information 2015 [employer]. This information is not intended to replace advice given to you by your health care provider. Make sure you discuss any questions you have with your health care provider.  

## 2014-09-16 ENCOUNTER — Other Ambulatory Visit: Payer: Self-pay | Admitting: Internal Medicine

## 2015-02-07 ENCOUNTER — Other Ambulatory Visit: Payer: Self-pay | Admitting: Internal Medicine

## 2015-02-10 ENCOUNTER — Other Ambulatory Visit: Payer: Self-pay | Admitting: Internal Medicine

## 2015-02-12 ENCOUNTER — Other Ambulatory Visit: Payer: Self-pay | Admitting: Internal Medicine

## 2015-02-14 ENCOUNTER — Other Ambulatory Visit: Payer: Self-pay | Admitting: Internal Medicine

## 2015-02-14 MED ORDER — CITALOPRAM HYDROBROMIDE 20 MG PO TABS: 20.0000 mg | ORAL_TABLET | Freq: Every day | ORAL | Status: DC

## 2015-02-14 NOTE — Telephone Encounter (Signed)
Refill sent in for 1 month of celexa and appointment scheduled. Thanks!

## 2015-02-20 ENCOUNTER — Ambulatory Visit (INDEPENDENT_AMBULATORY_CARE_PROVIDER_SITE_OTHER): Payer: Self-pay | Admitting: Internal Medicine

## 2015-02-20 ENCOUNTER — Encounter: Payer: Self-pay | Admitting: Internal Medicine

## 2015-02-20 VITALS — BP 162/104 | HR 88 | Temp 98.4°F | Resp 16 | Ht 66.0 in | Wt 188.0 lb

## 2015-02-20 DIAGNOSIS — R739 Hyperglycemia, unspecified: Secondary | ICD-10-CM

## 2015-02-20 DIAGNOSIS — I10 Essential (primary) hypertension: Secondary | ICD-10-CM

## 2015-02-20 DIAGNOSIS — R5383 Other fatigue: Secondary | ICD-10-CM

## 2015-02-20 DIAGNOSIS — E559 Vitamin D deficiency, unspecified: Secondary | ICD-10-CM

## 2015-02-20 DIAGNOSIS — E038 Other specified hypothyroidism: Secondary | ICD-10-CM

## 2015-02-20 LAB — CBC WITH DIFFERENTIAL/PLATELET
BASOS PCT: 1 % (ref 0–1)
Basophils Absolute: 0.1 10*3/uL (ref 0.0–0.1)
EOS ABS: 0.1 10*3/uL (ref 0.0–0.7)
Eosinophils Relative: 2 % (ref 0–5)
HCT: 39 % (ref 36.0–46.0)
Hemoglobin: 13.3 g/dL (ref 12.0–15.0)
Lymphocytes Relative: 31 % (ref 12–46)
Lymphs Abs: 2 10*3/uL (ref 0.7–4.0)
MCH: 31.1 pg (ref 26.0–34.0)
MCHC: 34.1 g/dL (ref 30.0–36.0)
MCV: 91.1 fL (ref 78.0–100.0)
MPV: 9.5 fL (ref 8.6–12.4)
Monocytes Absolute: 0.5 10*3/uL (ref 0.1–1.0)
Monocytes Relative: 8 % (ref 3–12)
Neutro Abs: 3.7 10*3/uL (ref 1.7–7.7)
Neutrophils Relative %: 58 % (ref 43–77)
Platelets: 354 10*3/uL (ref 150–400)
RBC: 4.28 MIL/uL (ref 3.87–5.11)
RDW: 13.2 % (ref 11.5–15.5)
WBC: 6.3 10*3/uL (ref 4.0–10.5)

## 2015-02-20 LAB — COMPREHENSIVE METABOLIC PANEL
ALBUMIN: 4.1 g/dL (ref 3.5–5.2)
ALT: 8 U/L (ref 0–35)
AST: 14 U/L (ref 0–37)
Alkaline Phosphatase: 58 U/L (ref 39–117)
BUN: 16 mg/dL (ref 6–23)
CO2: 31 meq/L (ref 19–32)
CREATININE: 0.87 mg/dL (ref 0.50–1.10)
Calcium: 9.6 mg/dL (ref 8.4–10.5)
Chloride: 101 mEq/L (ref 96–112)
GLUCOSE: 77 mg/dL (ref 70–99)
POTASSIUM: 4 meq/L (ref 3.5–5.3)
Sodium: 141 mEq/L (ref 135–145)
Total Bilirubin: 0.4 mg/dL (ref 0.2–1.2)
Total Protein: 6.8 g/dL (ref 6.0–8.3)

## 2015-02-20 LAB — HEMOGLOBIN A1C
Hgb A1c MFr Bld: 5.8 % — ABNORMAL HIGH (ref <5.7)
Mean Plasma Glucose: 120 mg/dL — ABNORMAL HIGH (ref <117)

## 2015-02-20 LAB — TSH: TSH: 46.467 u[IU]/mL — ABNORMAL HIGH (ref 0.350–4.500)

## 2015-02-20 MED ORDER — LISINOPRIL 40 MG PO TABS: 40.0000 mg | ORAL_TABLET | Freq: Every day | ORAL | Status: AC

## 2015-02-20 MED ORDER — AMLODIPINE BESYLATE 5 MG PO TABS: 5.0000 mg | ORAL_TABLET | Freq: Every day | ORAL | Status: AC

## 2015-02-20 NOTE — Progress Notes (Signed)
Patient ID: [name], female   DOB: 02-Feb-1966, 49 y.o.   MRN: [id number]   Susan Parsons, is a 49 y.o. female  [id number]  [id number]  DOB - Jan 04, 1966  CC:  Chief Complaint  Patient presents with  . Follow-up  . Hypertension       HPI: Susan Parsons is a 49 y.o. female here today to follow up on HTn. She has been without medications for 2 weeks and her BP is elevated today.She reports that her BP has been running 120/85 with Lisinopril and Norvasc. She has not kept her appointment since last year.    Pt also c/o generalized fatigue. She has been taking her Synthroid without break in therapy and she has been having irregular menses with last menses being very heavy and lasting for 7 days.  She also was diagnosed with Vitamin D deficiency but has not followed up with taking vitamin D.   Pt was recently treated for CAP at an Urgent Care and is on her last day of Azithromycin.  Patient has No headache, No chest pain, No abdominal pain - No Nausea, No new weakness tingling or numbness, No Cough - SOB.  No Known Allergies Past Medical History  Diagnosis Date  . Allergy   . Hypertension   . Thyroid disease   . Depression    Current Outpatient Prescriptions on File Prior to Visit  Medication Sig Dispense Refill  . citalopram (CELEXA) 20 MG tablet Take 1 tablet (20 mg total) by mouth daily. 30 tablet 0  . HYDROcodone-acetaminophen (NORCO/VICODIN) 5-325 MG per tablet Take 2 tablets by mouth every 4 (four) hours as needed. 20 tablet 0  . ibuprofen (ADVIL,MOTRIN) 800 MG tablet Take 1 tablet (800 mg total) by mouth 3 (three) times daily. 21 tablet 0  . levothyroxine (SYNTHROID, LEVOTHROID) 125 MCG tablet Take 1 tablet (125 mcg total) by mouth daily before breakfast. 30 tablet 11   No current facility-administered medications on file prior to visit.   Family History  Problem Relation Age of Onset  . COPD Mother   . Hypertension Mother   . Stroke Mother   .  Hypertension Father   . Hypertension Brother    History   Social History  . Marital Status: Married    Spouse Name: N/A  . Number of Children: N/A  . Years of Education: N/A   Occupational History  . Not on file.   Social History Main Topics  . Smoking status: Not on file  . Smokeless tobacco: Not on file  . Alcohol Use: No  . Drug Use: No  . Sexual Activity: Yes    Birth Control/ Protection: None     Comment: LNMP 04/02/2014   Other Topics Concern  . Not on file   Social History Narrative   ** Merged History Encounter **        Review of Systems: Constitutional: Negative for fever, chills, diaphoresis, activity change, appetite change. HENT: Negative for ear pain, nosebleeds, congestion, facial swelling, rhinorrhea, neck pain, neck stiffness and ear discharge.  Eyes: Negative for pain, discharge, redness, itching and visual disturbance. Respiratory: Negative for cough, choking, chest tightness, shortness of breath, wheezing and stridor.  Cardiovascular: Negative for chest pain, palpitations and leg swelling. Gastrointestinal: Negative for abdominal distention. Genitourinary: Negative for dysuria, urgency, frequency, hematuria, flank pain, decreased urine volume, difficulty urinating and dyspareunia.  Musculoskeletal: Negative for back pain, joint swelling, arthralgia and gait problem. Neurological: Negative for dizziness, tremors, seizures, syncope, facial asymmetry, speech  difficulty, weakness, light-headedness, numbness and headaches.  Hematological: Negative for adenopathy. Does not bruise/bleed easily. Psychiatric/Behavioral: Negative for hallucinations, behavioral problems, confusion, dysphoric mood, decreased concentration and agitation.     Objective:   Filed Vitals:   02/20/15 1509  BP: 162/104  Pulse: 88  Temp: 98.4 F (36.9 C)  Resp: 16    Physical Exam: Constitutional: Patient appears well-developed and well-nourished. No distress. HENT:  Normocephalic, atraumatic, External right and left ear normal. Oropharynx is clear and moist.  Eyes: Conjunctivae and EOM are normal. PERRLA, no scleral icterus. Neck: Normal ROM. Neck supple. No JVD. No tracheal deviation. No thyromegaly. CVS: RRR, S1/S2 +, no murmurs, no gallops, no carotid bruit.  Pulmonary: Effort and breath sounds normal, no stridor, rhonchi, wheezes, rales.  Abdominal: Soft. BS +, no distension, tenderness, rebound or guarding.  Musculoskeletal: Normal range of motion. No edema and no tenderness.  Lymphadenopathy: No lymphadenopathy noted, cervical, inguinal or axillary Neuro: Alert. Normal reflexes, muscle tone coordination. No cranial nerve deficit. Skin: Skin is warm and dry. No rash noted. Not diaphoretic. No erythema. No pallor. Psychiatric: Normal mood and affect. Behavior, judgment, thought content normal.   Lab Results  Component Value Date   WBC 9.3 02/26/2014   HGB 14.3 02/26/2014   HCT 41.3 02/26/2014   MCV 96.0 02/26/2014   PLT 385 02/26/2014   Lab Results  Component Value Date   CREATININE 0.53 02/26/2014   BUN 5* 02/26/2014   NA 140 02/26/2014   K 3.7 02/26/2014   CL 100 02/26/2014   CO2 25 02/26/2014    No results found for: HGBA1C Lipid Panel     Component Value Date/Time   CHOL 167 04/25/2014 1215   TRIG 141 04/25/2014 1215   HDL 31* 04/25/2014 1215   CHOLHDL 5.4 04/25/2014 1215   VLDL 28 04/25/2014 1215   LDLCALC 108* 04/25/2014 1215       Assessment and plan:   1. Accelerated essential hypertension Pt has been without medications for 2 weeks. She had no orthostatic symptoms while on Norvasc and lisinopril. Will resume previous doses and re-check in 1 week.  - Comprehensive metabolic panel - lisinopril (PRINIVIL,ZESTRIL) 40 MG tablet; Take 1 tablet (40 mg total) by mouth daily.  Dispense: 30 tablet; Refill: 1 - amLODipine (NORVASC) 5 MG tablet; Take 1 tablet (5 mg total) by mouth daily.  Dispense: 30 tablet; Refill: 1  2.  Other fatigue - Pt has been feeling fatigued. Has been with oligomenorrhea. She denies vaso-motor symptoms. Will check CBC with diff to ensure no anemia contributing to fatigue.  - CBC with Differential/Platelet - TSH  3. Other specified hypothyroidism - Check TSH in light of fatigue. - TSH  4. Hyperglycemia - Hemoglobin A1C  5. Vitamin D deficiency - Vitamin D, 25-hydroxy    Follow-up in 6 weeks for Annual Visit and HTN  The patient was given clear instructions to go to ER or return to medical center if symptoms don't improve, worsen or new problems develop. The patient verbalized understanding. The patient was told to call to get lab results if they haven't heard anything in the next week.     This note has been created with Education officer, environmentalDragon speech recognition software and smart phrase technology. Any transcriptional errors are unintentional.    Abrey Bradway A., MD [facility] [place], [place] [phone]   02/20/2015, 3:40 PM

## 2015-02-21 LAB — VITAMIN D 25 HYDROXY (VIT D DEFICIENCY, FRACTURES): Vit D, 25-Hydroxy: 14 ng/mL — ABNORMAL LOW (ref 30–100)

## 2015-02-21 LAB — MICROALBUMIN, URINE: Microalb, Ur: 10.5 mg/dL — ABNORMAL HIGH (ref <2.0)

## 2015-03-06 ENCOUNTER — Other Ambulatory Visit: Payer: Self-pay | Admitting: Internal Medicine

## 2015-03-06 DIAGNOSIS — E038 Other specified hypothyroidism: Secondary | ICD-10-CM

## 2015-03-06 MED ORDER — LEVOTHYROXINE SODIUM 125 MCG PO TABS: 125.0000 ug | ORAL_TABLET | Freq: Every day | ORAL | Status: AC

## 2015-03-06 NOTE — Telephone Encounter (Signed)
Refill for levothyroxine sent into pharmacy

## 2015-03-24 ENCOUNTER — Other Ambulatory Visit: Payer: Self-pay | Admitting: Family Medicine

## 2015-03-24 ENCOUNTER — Telehealth: Payer: Self-pay | Admitting: Internal Medicine

## 2015-04-10 ENCOUNTER — Encounter: Payer: Self-pay | Admitting: Family Medicine

## 2015-05-08 ENCOUNTER — Encounter: Payer: Self-pay | Admitting: Family Medicine

## 2015-05-12 ENCOUNTER — Other Ambulatory Visit: Payer: Self-pay | Admitting: Internal Medicine

## 2020-05-08 NOTE — Telephone Encounter (Signed)
In error
